# Patient Record
Sex: Female | Born: 1965 | Race: White | Hispanic: No | Marital: Married | Smoking: Never smoker
Health system: Southern US, Community
[De-identification: ages and names within clinical notes are randomized; demographics above are authoritative.]

## PROBLEM LIST (undated history)

## (undated) DIAGNOSIS — Q330 Congenital cystic lung: Secondary | ICD-10-CM

## (undated) DIAGNOSIS — J939 Pneumothorax, unspecified: Principal | ICD-10-CM

## (undated) DIAGNOSIS — M1990 Unspecified osteoarthritis, unspecified site: Secondary | ICD-10-CM

## (undated) DIAGNOSIS — Z460 Encounter for fitting and adjustment of spectacles and contact lenses: Secondary | ICD-10-CM

## (undated) DIAGNOSIS — M24022 Loose body in left elbow: Secondary | ICD-10-CM

## (undated) HISTORY — PX: CHEST TUBE INSERTION: SHX231

## (undated) HISTORY — DX: Pneumothorax, unspecified: J93.9

---

## 1988-01-14 HISTORY — PX: ORIF ELBOW FRACTURE: SUR928

## 1995-01-14 DIAGNOSIS — J939 Pneumothorax, unspecified: Secondary | ICD-10-CM

## 1995-01-14 HISTORY — DX: Pneumothorax, unspecified: J93.9

## 1997-01-13 HISTORY — PX: PLEURAL SCARIFICATION: SHX748

## 1999-01-14 HISTORY — PX: OVARY SURGERY: SHX727

## 2005-01-13 HISTORY — PX: LUMBAR LAMINECTOMY: SHX95

## 2006-12-23 ENCOUNTER — Emergency Department (HOSPITAL_COMMUNITY): Admission: EM | Admit: 2006-12-23 | Discharge: 2006-12-23 | Payer: Self-pay | Admitting: Family Medicine

## 2006-12-30 ENCOUNTER — Emergency Department (HOSPITAL_COMMUNITY): Admission: EM | Admit: 2006-12-30 | Discharge: 2006-12-30 | Payer: Self-pay | Admitting: Family Medicine

## 2007-01-21 ENCOUNTER — Emergency Department (HOSPITAL_COMMUNITY): Admission: EM | Admit: 2007-01-21 | Discharge: 2007-01-21 | Payer: Self-pay | Admitting: Emergency Medicine

## 2007-02-16 ENCOUNTER — Encounter: Admission: RE | Admit: 2007-02-16 | Discharge: 2007-02-16 | Payer: Self-pay | Admitting: Family Medicine

## 2007-07-06 ENCOUNTER — Other Ambulatory Visit: Admission: RE | Admit: 2007-07-06 | Discharge: 2007-07-06 | Payer: Self-pay | Admitting: Family Medicine

## 2007-09-09 ENCOUNTER — Encounter: Admission: RE | Admit: 2007-09-09 | Discharge: 2007-09-09 | Payer: Self-pay | Admitting: Family Medicine

## 2008-11-14 ENCOUNTER — Ambulatory Visit (HOSPITAL_COMMUNITY): Admission: RE | Admit: 2008-11-14 | Discharge: 2008-11-14 | Payer: Self-pay | Admitting: Family Medicine

## 2008-11-15 ENCOUNTER — Encounter: Admission: RE | Admit: 2008-11-15 | Discharge: 2008-11-15 | Payer: Self-pay | Admitting: Family Medicine

## 2008-12-22 ENCOUNTER — Other Ambulatory Visit: Admission: RE | Admit: 2008-12-22 | Discharge: 2008-12-22 | Payer: Self-pay | Admitting: Family Medicine

## 2009-11-16 ENCOUNTER — Ambulatory Visit (HOSPITAL_COMMUNITY): Admission: RE | Admit: 2009-11-16 | Discharge: 2009-11-16 | Payer: Self-pay | Admitting: Family Medicine

## 2009-12-24 ENCOUNTER — Other Ambulatory Visit
Admission: RE | Admit: 2009-12-24 | Discharge: 2009-12-24 | Payer: Self-pay | Source: Home / Self Care | Admitting: Family Medicine

## 2009-12-24 ENCOUNTER — Encounter
Admission: RE | Admit: 2009-12-24 | Discharge: 2009-12-24 | Payer: Self-pay | Source: Home / Self Care | Attending: Family Medicine | Admitting: Family Medicine

## 2010-10-18 LAB — CBC
HCT: 43.3
Hemoglobin: 14.8
MCV: 93
Platelets: 295
WBC: 8.6

## 2010-10-18 LAB — DIFFERENTIAL
Eosinophils Absolute: 0.2
Eosinophils Relative: 2
Lymphocytes Relative: 25
Lymphs Abs: 2.1
Monocytes Absolute: 0.9

## 2010-11-08 ENCOUNTER — Other Ambulatory Visit (HOSPITAL_COMMUNITY): Payer: Self-pay | Admitting: Family Medicine

## 2010-11-08 DIAGNOSIS — Z1231 Encounter for screening mammogram for malignant neoplasm of breast: Secondary | ICD-10-CM

## 2010-12-11 ENCOUNTER — Ambulatory Visit (HOSPITAL_COMMUNITY)
Admission: RE | Admit: 2010-12-11 | Discharge: 2010-12-11 | Disposition: A | Discharge status: Home / Self Care | Payer: BC Managed Care – PPO | Source: Ambulatory Visit | Attending: Family Medicine | Admitting: Family Medicine

## 2010-12-11 DIAGNOSIS — Z1231 Encounter for screening mammogram for malignant neoplasm of breast: Secondary | ICD-10-CM

## 2010-12-17 ENCOUNTER — Other Ambulatory Visit: Payer: Self-pay | Admitting: Family Medicine

## 2010-12-17 DIAGNOSIS — R928 Other abnormal and inconclusive findings on diagnostic imaging of breast: Secondary | ICD-10-CM

## 2010-12-24 ENCOUNTER — Ambulatory Visit
Admission: RE | Admit: 2010-12-24 | Discharge: 2010-12-24 | Disposition: A | Discharge status: Home / Self Care | Payer: BC Managed Care – PPO | Source: Ambulatory Visit | Attending: Family Medicine | Admitting: Family Medicine

## 2010-12-24 ENCOUNTER — Other Ambulatory Visit: Payer: BC Managed Care – PPO

## 2010-12-24 ENCOUNTER — Other Ambulatory Visit: Payer: Self-pay | Admitting: Family Medicine

## 2010-12-24 DIAGNOSIS — R928 Other abnormal and inconclusive findings on diagnostic imaging of breast: Secondary | ICD-10-CM

## 2010-12-24 HISTORY — PX: BREAST BIOPSY: SHX20

## 2011-06-20 ENCOUNTER — Telehealth: Payer: Self-pay | Admitting: Genetic Counselor

## 2011-06-20 NOTE — Telephone Encounter (Signed)
lmonvm for pt re calling me for appt w/Karen Lowell Guitar for genetics.

## 2011-06-23 ENCOUNTER — Telehealth: Payer: Self-pay | Admitting: Genetic Counselor

## 2011-06-23 NOTE — Telephone Encounter (Signed)
Pt returned call re genetics appt and was given appt for 6/13 @ 11 am.

## 2011-06-24 ENCOUNTER — Telehealth: Payer: Self-pay | Admitting: Genetic Counselor

## 2011-06-24 NOTE — Telephone Encounter (Signed)
No call was made.  Accidentally opened this.

## 2011-06-26 ENCOUNTER — Encounter: Payer: Self-pay | Admitting: Genetic Counselor

## 2011-06-26 ENCOUNTER — Other Ambulatory Visit: Payer: BC Managed Care – PPO | Admitting: Lab

## 2011-06-26 ENCOUNTER — Ambulatory Visit (HOSPITAL_BASED_OUTPATIENT_CLINIC_OR_DEPARTMENT_OTHER): Payer: BC Managed Care – PPO | Admitting: Genetic Counselor

## 2011-06-26 DIAGNOSIS — J9383 Other pneumothorax: Secondary | ICD-10-CM

## 2011-06-26 DIAGNOSIS — J939 Pneumothorax, unspecified: Secondary | ICD-10-CM

## 2011-06-26 DIAGNOSIS — IMO0002 Reserved for concepts with insufficient information to code with codable children: Secondary | ICD-10-CM

## 2011-06-26 NOTE — Progress Notes (Signed)
Dr. Valentina Lucks requested a consultation for genetic counseling and risk assessment for Kim Garner, a 46 y.o. female, for discussion of her personal history pneumothroax and family history of Birt-Hogg-Dube (BHD) syndrome. She presents to clinic today to discuss the possibility of a genetic predisposition to cancer, and to further clarify her risks, as well as her family members' risks for cancer.   HISTORY OF PRESENT ILLNESS: In 1997, at the age of 5, Kim Garner was diagnosed with a pneumothorax.  In 1999 at the age of 55, she was diagnosed with a second pnuemothorax.  At that time she had multiple CT scans of her chest/lungs which found lung cysts and belbs. This was treated with surgery, and reportedly performed so that scar tissue would help with reinforcing the lung and keeping it from recurring.    Past Medical History  Diagnosis Date  . Pneumothorax 1997    Pneumothorax in 1997 and 1999     REPRODUCTIVE HISTORY AND PERSONAL RISK ASSESSMENT FACTORS: Chest CT performed in 1997 and 1999 - bilateral blebs Never had a CT, u/s or MRI of the kidneys Is scheduled with a dermatologist in the next few weeks  FAMILY HISTORY:  We obtained a detailed, 4-generation family history.  Significant diagnoses are listed below: Family History  Problem Relation Age of Onset  . Lung disease Maternal Uncle     pneumothorax in 65s  . Lung disease Cousin 30    pneumothorax  . Lung disease Other     Birt Noemi Chapel syndrome  The patient was diagnosed with a pneumothorax at ages 39 and 27, which was treated with surgery.  She reports have several white bumps on her skin which have not caused her any problems.  She has a sister who has never had a pneumothorax and has not been worked up for BHD syndrome.  Her mother is healthy at age 67, and never had a pneumothorax or kidney disease.  The patient has a healthy aunt, and an uncle who had a pneumothroax in his 56s.  This uncle has three children, and  his daughter, who is 42, had a pneumothorax.  The patients' maternal grandmother, four of her brothers and at least one sister, and her maternal aunt had pneumothoraces.  The sister had a daughter who had renal cell carcinoma, and tested positive for BHD syndrome.  This individual has two daughters who have also tested positive for BHD syndrome.  The patient's father has an irregular heartbeat, at age 37.  He had a brother who had prostate cancer and died at 59 from a form of leukemia.  Her paternal grandmother had breast cancer and died in her 39s, and her grandfather died in his 45s from leukemia.  Patient's maternal ancestors are of [place] and [place] descent, and paternal ancestors are of [place] and [place] descent. There is no reported Ashkenazi Jewish ancestry. There is possible known consanguinity between her maternal grandparents.  GENETIC COUNSELING RISK ASSESSMENT, DISCUSSION, AND SUGGESTED FOLLOW UP: We reviewed the natural history and genetic etiology of sporadic, familial and hereditary cancer syndromes.  We discussed the signs and symptoms of BHD syndrome, and the dominant inheritance pattern.  Based on her current symptoms it is suggestive that she is affected, although genetic testing would help with diagnosis.  We reviewed genetic testing and that it would be best to have a copy of her family members test result.  This would allow for targeted testing which would reduce the cost of testing and  ensure that we are testing for the correct mutation.  She was provided information about the national support group, and we discussed the pros and cons of being seen in a center of excellence for BHD syndrome.  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 If a mutation indicating BHD syndrome is  detected in this case, the [place] BHD Consortium recommendations would include annual MRI scans starting at age 58. If a mutation is detected, the patient will be referred back to the referring provider and to any additional appropriate care providers to discuss the relevant options.   After considering the risks, benefits, and limitations, the patient provided agreed to try to get a copy of her cousins test so that we may know the exact family mutation and the laboratory in which the original test was performed.   Per the patient's request, she will contact me once she has the report and we can set up a blood draw at that time.  The patient was seen for a total of 45 minutes, greater than 50% of which was spent face-to-face counseling.  This plan is being carried out per Dr. Jone Baseman recommendations.  This note will also be sent to the referring provider via the electronic medical record. The patient will be supplied with a summary of this genetic counseling discussion as well as educational information on the discussed hereditary cancer syndromes following the conclusion of their visit.   Patient was discussed with Dr. Drue Second.  EDUCATIONAL INFORMATION SUPPLIED TO PATIENT AT ENCOUNTER:  Genetics Home Reference Print out on BHD syndrome Brochures from the BHD syndrome support group   _______________________________________________________________________ For Office Staff:  Number of people involved in session: 2 Was an Intern/ student involved with case: not applicable

## 2011-07-02 ENCOUNTER — Telehealth: Payer: Self-pay | Admitting: *Deleted

## 2011-07-02 NOTE — Telephone Encounter (Signed)
Left message for pt to return my call so I can schedule a genetic f/u appt.

## 2011-07-08 ENCOUNTER — Telehealth: Payer: Self-pay | Admitting: *Deleted

## 2011-07-08 NOTE — Telephone Encounter (Signed)
Patient returned my call and I confirmed 08/11/11 genetic appt w/ pt.

## 2011-07-24 ENCOUNTER — Other Ambulatory Visit: Payer: BC Managed Care – PPO | Admitting: Lab

## 2011-08-11 ENCOUNTER — Other Ambulatory Visit: Payer: BC Managed Care – PPO | Admitting: Lab

## 2011-08-11 ENCOUNTER — Encounter: Payer: BC Managed Care – PPO | Admitting: Genetic Counselor

## 2011-09-02 ENCOUNTER — Encounter: Payer: Self-pay | Admitting: Genetic Counselor

## 2011-09-11 ENCOUNTER — Other Ambulatory Visit: Payer: Self-pay | Admitting: Family Medicine

## 2011-09-11 DIAGNOSIS — Z148 Genetic carrier of other disease: Secondary | ICD-10-CM

## 2011-09-18 ENCOUNTER — Ambulatory Visit
Admission: RE | Admit: 2011-09-18 | Discharge: 2011-09-18 | Disposition: A | Discharge status: Home / Self Care | Payer: BC Managed Care – PPO | Source: Ambulatory Visit | Attending: Family Medicine | Admitting: Family Medicine

## 2011-09-18 DIAGNOSIS — Z148 Genetic carrier of other disease: Secondary | ICD-10-CM

## 2011-09-18 MED ORDER — GADOBENATE DIMEGLUMINE 529 MG/ML IV SOLN
15.0000 mL | Freq: Once | INTRAVENOUS | Status: AC | PRN
  Administered 2011-09-18: 15 mL via INTRAVENOUS

## 2011-12-18 ENCOUNTER — Other Ambulatory Visit: Payer: Self-pay | Admitting: Family Medicine

## 2011-12-18 DIAGNOSIS — Z1231 Encounter for screening mammogram for malignant neoplasm of breast: Secondary | ICD-10-CM

## 2012-01-13 ENCOUNTER — Ambulatory Visit
Admission: RE | Admit: 2012-01-13 | Discharge: 2012-01-13 | Disposition: A | Discharge status: Home / Self Care | Payer: BC Managed Care – PPO | Source: Ambulatory Visit | Attending: Family Medicine | Admitting: Family Medicine

## 2012-01-13 DIAGNOSIS — Z1231 Encounter for screening mammogram for malignant neoplasm of breast: Secondary | ICD-10-CM

## 2012-12-20 ENCOUNTER — Other Ambulatory Visit: Payer: Self-pay | Admitting: Orthopedic Surgery

## 2012-12-20 ENCOUNTER — Encounter (HOSPITAL_BASED_OUTPATIENT_CLINIC_OR_DEPARTMENT_OTHER): Payer: Self-pay | Admitting: *Deleted

## 2012-12-20 NOTE — Progress Notes (Signed)
No labs needed

## 2012-12-21 ENCOUNTER — Ambulatory Visit (HOSPITAL_BASED_OUTPATIENT_CLINIC_OR_DEPARTMENT_OTHER): Payer: BC Managed Care – PPO | Admitting: *Deleted

## 2012-12-21 ENCOUNTER — Encounter (HOSPITAL_BASED_OUTPATIENT_CLINIC_OR_DEPARTMENT_OTHER)
Admission: RE | Disposition: A | Discharge status: Home / Self Care | Payer: Self-pay | Source: Ambulatory Visit | Attending: Orthopedic Surgery

## 2012-12-21 ENCOUNTER — Ambulatory Visit (HOSPITAL_BASED_OUTPATIENT_CLINIC_OR_DEPARTMENT_OTHER)
Admission: RE | Admit: 2012-12-21 | Discharge: 2012-12-21 | Disposition: A | Discharge status: Home / Self Care | Payer: BC Managed Care – PPO | Source: Ambulatory Visit | Attending: Orthopedic Surgery | Admitting: Orthopedic Surgery

## 2012-12-21 ENCOUNTER — Encounter (HOSPITAL_BASED_OUTPATIENT_CLINIC_OR_DEPARTMENT_OTHER): Payer: BC Managed Care – PPO | Admitting: *Deleted

## 2012-12-21 ENCOUNTER — Encounter (HOSPITAL_BASED_OUTPATIENT_CLINIC_OR_DEPARTMENT_OTHER): Payer: Self-pay

## 2012-12-21 DIAGNOSIS — M24029 Loose body in unspecified elbow: Secondary | ICD-10-CM | POA: Insufficient documentation

## 2012-12-21 DIAGNOSIS — M24022 Loose body in left elbow: Secondary | ICD-10-CM | POA: Diagnosis present

## 2012-12-21 DIAGNOSIS — M129 Arthropathy, unspecified: Secondary | ICD-10-CM | POA: Insufficient documentation

## 2012-12-21 HISTORY — PX: ELBOW ARTHROSCOPY: SHX614

## 2012-12-21 HISTORY — DX: Loose body in left elbow: M24.022

## 2012-12-21 HISTORY — DX: Unspecified osteoarthritis, unspecified site: M19.90

## 2012-12-21 HISTORY — DX: Encounter for fitting and adjustment of spectacles and contact lenses: Z46.0

## 2012-12-21 HISTORY — DX: Congenital cystic lung: Q33.0

## 2012-12-21 HISTORY — PX: FOREIGN BODY REMOVAL: SHX962

## 2012-12-21 LAB — POCT HEMOGLOBIN-HEMACUE: Hemoglobin: 14.7 g/dL (ref 12.0–15.0)

## 2012-12-21 SURGERY — ARTHROSCOPY, ELBOW, WITH OPEN SURGERY IF INDICATED
Anesthesia: General | Site: Elbow | Laterality: Left

## 2012-12-21 MED ORDER — PROPOFOL 10 MG/ML IV BOLUS
INTRAVENOUS | Status: DC | PRN
  Administered 2012-12-21: 170 mg via INTRAVENOUS

## 2012-12-21 MED ORDER — SUCCINYLCHOLINE CHLORIDE 20 MG/ML IJ SOLN
INTRAMUSCULAR | Status: AC
  Filled 2012-12-21: qty 2

## 2012-12-21 MED ORDER — HYDROMORPHONE HCL PF 1 MG/ML IJ SOLN
INTRAMUSCULAR | Status: AC
  Filled 2012-12-21: qty 1

## 2012-12-21 MED ORDER — ONDANSETRON HCL 4 MG/2ML IJ SOLN
INTRAMUSCULAR | Status: DC | PRN
  Administered 2012-12-21: 4 mg via INTRAVENOUS

## 2012-12-21 MED ORDER — OXYCODONE HCL 5 MG PO TABS
ORAL_TABLET | ORAL | Status: AC
  Filled 2012-12-21: qty 1

## 2012-12-21 MED ORDER — KETOROLAC TROMETHAMINE 10 MG PO TABS: 10.0000 mg | ORAL_TABLET | Freq: Four times a day (QID) | ORAL | Status: DC | PRN

## 2012-12-21 MED ORDER — BUPIVACAINE HCL (PF) 0.25 % IJ SOLN
INTRAMUSCULAR | Status: DC | PRN
  Administered 2012-12-21: 10 mL

## 2012-12-21 MED ORDER — FENTANYL CITRATE 0.05 MG/ML IJ SOLN
INTRAMUSCULAR | Status: AC
  Filled 2012-12-21: qty 4

## 2012-12-21 MED ORDER — HYDROMORPHONE HCL PF 1 MG/ML IJ SOLN
0.2500 mg | INTRAMUSCULAR | Status: DC | PRN
  Administered 2012-12-21 (×4): 0.5 mg via INTRAVENOUS

## 2012-12-21 MED ORDER — BUPIVACAINE HCL (PF) 0.25 % IJ SOLN
INTRAMUSCULAR | Status: AC
  Filled 2012-12-21: qty 30

## 2012-12-21 MED ORDER — MIDAZOLAM HCL 5 MG/5ML IJ SOLN
INTRAMUSCULAR | Status: DC | PRN
  Administered 2012-12-21: 2 mg via INTRAVENOUS

## 2012-12-21 MED ORDER — LACTATED RINGERS IV SOLN
INTRAVENOUS | Status: DC
  Administered 2012-12-21 (×2): via INTRAVENOUS

## 2012-12-21 MED ORDER — OXYCODONE-ACETAMINOPHEN 10-325 MG PO TABS: 1.0000 | ORAL_TABLET | Freq: Four times a day (QID) | ORAL | Status: DC | PRN

## 2012-12-21 MED ORDER — PROMETHAZINE HCL 25 MG PO TABS: 25.0000 mg | ORAL_TABLET | Freq: Four times a day (QID) | ORAL | Status: DC | PRN

## 2012-12-21 MED ORDER — FENTANYL CITRATE 0.05 MG/ML IJ SOLN: 50.0000 ug | INTRAMUSCULAR | Status: DC | PRN

## 2012-12-21 MED ORDER — ONDANSETRON HCL 4 MG/2ML IJ SOLN: 4.0000 mg | Freq: Four times a day (QID) | INTRAMUSCULAR | Status: DC | PRN

## 2012-12-21 MED ORDER — MIDAZOLAM HCL 2 MG/2ML IJ SOLN
INTRAMUSCULAR | Status: AC
  Filled 2012-12-21: qty 2

## 2012-12-21 MED ORDER — PROPOFOL 10 MG/ML IV EMUL
INTRAVENOUS | Status: AC
  Filled 2012-12-21: qty 100

## 2012-12-21 MED ORDER — SENNA-DOCUSATE SODIUM 8.6-50 MG PO TABS: 2.0000 | ORAL_TABLET | Freq: Every day | ORAL | Status: DC

## 2012-12-21 MED ORDER — SODIUM CHLORIDE 0.9 % IR SOLN
Status: DC | PRN
  Administered 2012-12-21: 3000 mL

## 2012-12-21 MED ORDER — DEXAMETHASONE SODIUM PHOSPHATE 10 MG/ML IJ SOLN
INTRAMUSCULAR | Status: DC | PRN
  Administered 2012-12-21: 10 mg via INTRAVENOUS

## 2012-12-21 MED ORDER — MIDAZOLAM HCL 2 MG/2ML IJ SOLN: 1.0000 mg | INTRAMUSCULAR | Status: DC | PRN

## 2012-12-21 MED ORDER — CEFAZOLIN SODIUM-DEXTROSE 2-3 GM-% IV SOLR
INTRAVENOUS | Status: AC
  Filled 2012-12-21: qty 50

## 2012-12-21 MED ORDER — CEFAZOLIN SODIUM-DEXTROSE 2-3 GM-% IV SOLR
2.0000 g | INTRAVENOUS | Status: AC
  Administered 2012-12-21: 2 g via INTRAVENOUS

## 2012-12-21 MED ORDER — OXYCODONE HCL 5 MG/5ML PO SOLN: 5.0000 mg | Freq: Once | ORAL | Status: AC | PRN

## 2012-12-21 MED ORDER — OXYCODONE HCL 5 MG PO TABS
5.0000 mg | ORAL_TABLET | Freq: Once | ORAL | Status: AC | PRN
  Administered 2012-12-21: 5 mg via ORAL

## 2012-12-21 MED ORDER — LIDOCAINE HCL (CARDIAC) 20 MG/ML IV SOLN
INTRAVENOUS | Status: DC | PRN
  Administered 2012-12-21: 60 mg via INTRAVENOUS

## 2012-12-21 MED ORDER — FENTANYL CITRATE 0.05 MG/ML IJ SOLN
INTRAMUSCULAR | Status: DC | PRN
  Administered 2012-12-21: 100 ug via INTRAVENOUS
  Administered 2012-12-21: 25 ug via INTRAVENOUS
  Administered 2012-12-21: 50 ug via INTRAVENOUS
  Administered 2012-12-21: 25 ug via INTRAVENOUS

## 2012-12-21 SURGICAL SUPPLY — 95 items
BANDAGE ELASTIC 4 VELCRO ST LF (GAUZE/BANDAGES/DRESSINGS) ×4 IMPLANT
BANDAGE ELASTIC 6 VELCRO ST LF (GAUZE/BANDAGES/DRESSINGS) IMPLANT
BANDAGE ESMARK 6X9 LF (GAUZE/BANDAGES/DRESSINGS) ×1 IMPLANT
BENZOIN TINCTURE PRP APPL 2/3 (GAUZE/BANDAGES/DRESSINGS) IMPLANT
BLADE CUDA GRT WHITE 3.5 (BLADE) IMPLANT
BLADE CUDA SHAVER 3.5 (BLADE) IMPLANT
BLADE CUTTER GATOR 3.5 (BLADE) ×2 IMPLANT
BLADE GREAT WHITE 4.2 (BLADE) IMPLANT
BLADE SURG 15 STRL LF DISP TIS (BLADE) IMPLANT
BLADE SURG 15 STRL SS (BLADE)
BNDG COHESIVE 4X5 TAN STRL (GAUZE/BANDAGES/DRESSINGS) ×2 IMPLANT
BNDG CONFORM 3 STRL LF (GAUZE/BANDAGES/DRESSINGS) IMPLANT
BNDG ESMARK 4X9 LF (GAUZE/BANDAGES/DRESSINGS) ×2 IMPLANT
BNDG ESMARK 6X9 LF (GAUZE/BANDAGES/DRESSINGS) ×2
BRUSH SCRUB EZ PLAIN DRY (MISCELLANEOUS) IMPLANT
BUR FULL RADIUS 2.9 (BURR) IMPLANT
BUR OVAL 4.0 (BURR) IMPLANT
CANISTER SUCT 1200ML W/VALVE (MISCELLANEOUS) ×2 IMPLANT
CANNULA ACUFLEX KIT 5X76 (CANNULA) IMPLANT
CANNULA SHOULDER 7CM (CANNULA) ×2 IMPLANT
COVER TABLE BACK 60X90 (DRAPES) IMPLANT
CUFF TOURNIQUET SINGLE 18IN (TOURNIQUET CUFF) ×2 IMPLANT
CUFF TOURNIQUET SINGLE 34IN LL (TOURNIQUET CUFF) IMPLANT
CUTTER MENISCUS  4.2MM (BLADE)
CUTTER MENISCUS 4.2MM (BLADE) IMPLANT
DECANTER SPIKE VIAL GLASS SM (MISCELLANEOUS) IMPLANT
DRAPE ARTHROSCOPY W/POUCH 90 (DRAPES) IMPLANT
DRAPE EXTREMITY T 121X128X90 (DRAPE) ×2 IMPLANT
DRAPE INCISE IOBAN 66X45 STRL (DRAPES) IMPLANT
DRAPE OEC MINIVIEW 54X84 (DRAPES) IMPLANT
DRAPE STERI 35X30 U-POUCH (DRAPES) ×2 IMPLANT
DRAPE U 20/CS (DRAPES) IMPLANT
DRAPE U-SHAPE 47X51 STRL (DRAPES) ×2 IMPLANT
DURAPREP 26ML APPLICATOR (WOUND CARE) ×2 IMPLANT
ELECT REM PT RETURN 9FT ADLT (ELECTROSURGICAL) ×2
ELECTRODE REM PT RTRN 9FT ADLT (ELECTROSURGICAL) ×1 IMPLANT
GAUZE XEROFORM 1X8 LF (GAUZE/BANDAGES/DRESSINGS) IMPLANT
GLOVE BIO SURGEON STRL SZ 6.5 (GLOVE) ×2 IMPLANT
GLOVE BIO SURGEON STRL SZ8 (GLOVE) ×2 IMPLANT
GLOVE BIO SURGEON STRL SZ8.5 (GLOVE) ×2 IMPLANT
GLOVE BIOGEL PI IND STRL 7.0 (GLOVE) ×1 IMPLANT
GLOVE BIOGEL PI IND STRL 8 (GLOVE) ×1 IMPLANT
GLOVE BIOGEL PI INDICATOR 7.0 (GLOVE) ×1
GLOVE BIOGEL PI INDICATOR 8 (GLOVE) ×1
GLOVE ORTHO TXT STRL SZ7.5 (GLOVE) ×2 IMPLANT
GLOVE SURG ORTHO 8.0 STRL STRW (GLOVE) ×2 IMPLANT
GOWN BRE IMP PREV XXLGXLNG (GOWN DISPOSABLE) ×4 IMPLANT
HANDPIECE INTERPULSE COAX TIP (DISPOSABLE)
NDL SAFETY ECLIPSE 18X1.5 (NEEDLE) IMPLANT
NEEDLE HYPO 18GX1.5 SHARP (NEEDLE)
NEEDLE HYPO 25X1 1.5 SAFETY (NEEDLE) IMPLANT
NEEDLE SPNL 18GX3.5 QUINCKE PK (NEEDLE) ×2 IMPLANT
NS IRRIG 1000ML POUR BTL (IV SOLUTION) IMPLANT
PACK ARTHROSCOPY DSU (CUSTOM PROCEDURE TRAY) ×2 IMPLANT
PACK BASIN DAY SURGERY FS (CUSTOM PROCEDURE TRAY) ×2 IMPLANT
PAD ABD 8X10 STRL (GAUZE/BANDAGES/DRESSINGS) IMPLANT
PAD CAST 4YDX4 CTTN HI CHSV (CAST SUPPLIES) ×1 IMPLANT
PADDING CAST ABS 4INX4YD NS (CAST SUPPLIES)
PADDING CAST ABS COTTON 4X4 ST (CAST SUPPLIES) IMPLANT
PADDING CAST COTTON 4X4 STRL (CAST SUPPLIES) ×1
PENCIL BUTTON HOLSTER BLD 10FT (ELECTRODE) IMPLANT
SET ARTHROSCOPY TUBING (MISCELLANEOUS) ×1
SET ARTHROSCOPY TUBING LN (MISCELLANEOUS) ×1 IMPLANT
SET HNDPC FAN SPRY TIP SCT (DISPOSABLE) IMPLANT
SHEET MEDIUM DRAPE 40X70 STRL (DRAPES) IMPLANT
SLEEVE SCD COMPRESS KNEE MED (MISCELLANEOUS) ×2 IMPLANT
SLING ARM FOAM STRAP LRG (SOFTGOODS) ×2 IMPLANT
SLING ARM FOAM STRAP MED (SOFTGOODS) IMPLANT
SPLINT FAST PLASTER 5X30 (CAST SUPPLIES) ×10
SPLINT PLASTER CAST FAST 5X30 (CAST SUPPLIES) ×10 IMPLANT
SPONGE GAUZE 4X4 12PLY (GAUZE/BANDAGES/DRESSINGS) ×2 IMPLANT
SPONGE LAP 18X18 X RAY DECT (DISPOSABLE) IMPLANT
SPONGE LAP 4X18 X RAY DECT (DISPOSABLE) ×2 IMPLANT
STAPLER VISISTAT 35W (STAPLE) IMPLANT
STOCKINETTE SYNTHETIC 6 UNSTER (CAST SUPPLIES) IMPLANT
STRIP CLOSURE SKIN 1/2X4 (GAUZE/BANDAGES/DRESSINGS) IMPLANT
SUT ETHILON 3 0 PS 1 (SUTURE) IMPLANT
SUT ETHILON 4 0 PS 2 18 (SUTURE) IMPLANT
SUT MNCRL AB 4-0 PS2 18 (SUTURE) ×2 IMPLANT
SUT VIC AB 0 CT1 27 (SUTURE)
SUT VIC AB 0 CT1 27XBRD ANBCTR (SUTURE) IMPLANT
SUT VIC AB 2-0 PS2 27 (SUTURE) IMPLANT
SUT VIC AB 2-0 SH 18 (SUTURE) IMPLANT
SUT VIC AB 2-0 SH 27 (SUTURE) ×1
SUT VIC AB 2-0 SH 27XBRD (SUTURE) ×1 IMPLANT
SUT VIC AB 3-0 SH 27 (SUTURE)
SUT VIC AB 3-0 SH 27X BRD (SUTURE) IMPLANT
SUT VICRYL 3-0 CR8 SH (SUTURE) IMPLANT
SUT VICRYL 4-0 PS2 18IN ABS (SUTURE) IMPLANT
SWAB COLLECTION DEVICE MRSA (MISCELLANEOUS) IMPLANT
SYR BULB 3OZ (MISCELLANEOUS) IMPLANT
TOWEL OR 17X24 6PK STRL BLUE (TOWEL DISPOSABLE) ×2 IMPLANT
TUBE ANAEROBIC SPECIMEN COL (MISCELLANEOUS) IMPLANT
UNDERPAD 30X30 INCONTINENT (UNDERPADS AND DIAPERS) ×2 IMPLANT
WATER STERILE IRR 1000ML POUR (IV SOLUTION) ×2 IMPLANT

## 2012-12-21 NOTE — Anesthesia Postprocedure Evaluation (Signed)
Anesthesia Post Note  Patient: Kim Garner  Procedure(s) Performed: Procedure(s) (LRB): LEFT ARTHROSCOPY ELBOW (Left) LEFT ELBOW REMOVAL LOSE BODY  (Left)  Anesthesia type: General  Patient location: PACU  Post pain: Pain level controlled and Adequate analgesia  Post assessment: Post-op Vital signs reviewed, Patient's Cardiovascular Status Stable, Respiratory Function Stable, Patent Airway and Pain level controlled  Last Vitals:  Filed Vitals:   12/21/12 1000  BP: 124/76  Pulse: 84  Temp:   Resp: 11    Post vital signs: Reviewed and stable  Level of consciousness: awake, alert  and oriented  Complications: No apparent anesthesia complications

## 2012-12-21 NOTE — H&P (Signed)
PREOPERATIVE H&P  Chief Complaint: LEFT ELBOW LOSE BODY  HPI: Kim Garner is a 47 y.o. female who presents for preoperative history and physical with a diagnosis of LEFT ELBOW LOSE BODY. Symptoms are rated as moderate to severe, and have been worsening.  This is significantly impairing activities of daily living.  She has elected for surgical management. She had a previous elbow fracture that was about 20 years ago, and then had a previous elbow arthroscopy with loose body removal done about 10 years ago. She developed acute onset pain, with acute loss of motion and a locked elbow about a week ago, presented to urgent care, was seen by myself yesterday, and elected for surgical management.  Past Medical History  Diagnosis Date  . Pneumothorax 1997    Pneumothorax in 1997 and 1999  . Arthritis   . Lung, cysts, congenital   . Contact lens/glasses fitting     wears contacts or glasses   Past Surgical History  Procedure Laterality Date  . 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 Chest tube insertion  9604,5409    lt spont pneumo  . Orif elbow fracture  1990    left  . Ovary surgery  2001    ovarian cyst  . Lumbar laminectomy  2007   History   Social History  . Marital Status: Married    Spouse Name: N/A    Number of Children: N/A  . Years of Education: N/A   Social History Main Topics  . Smoking status: Never Smoker   . Smokeless tobacco: Never Used  . Alcohol Use: Yes  . Drug Use: No  . Sexual Activity: None   Other Topics Concern  . None   Social History Narrative  . None   Family History  Problem Relation Age of Onset  . Lung disease Maternal Uncle     pneumothorax in 40s  . Lung disease Cousin 30    pneumothorax  . Lung disease Other     Birt Hogg Dube syndrome   Allergies  Allergen Reactions  . Pine Itching    PINE NUTS   Prior to Admission medications   Medication Sig Start Date End Date Taking? Authorizing Provider   diphenhydrAMINE (SOMINEX) 25 MG tablet Take 25 mg by mouth at bedtime as needed for sleep.   Yes Historical Provider, MD  fluticasone (FLONASE) 50 MCG/ACT nasal spray Place into both nostrils daily.   Yes Historical Provider, MD  norgestrel-ethinyl estradiol (LO/OVRAL,CRYSELLE) 0.3-30 MG-MCG tablet Take 1 tablet by mouth daily.   Yes Historical Provider, MD     Positive ROS: All other systems have been reviewed and were otherwise negative with the exception of those mentioned in the HPI and as above.  Physical Exam: General: Alert, no acute distress Cardiovascular: No pedal edema Respiratory: No cyanosis, no use of accessory musculature GI: No organomegaly, abdomen is soft and non-tender Skin: No lesions in the area of chief complaint Neurologic: Sensation intact distally Psychiatric: Patient is competent for consent with normal mood and affect Lymphatic: No axillary or cervical lymphadenopathy  MUSCULOSKELETAL: Left elbow has sensation intact throughout. Well-healed surgical wounds. Range of motion from 35 - 125.  MRI demonstrates large loose body, around the radiocapitellar joint  Assessment: LEFT ELBOW LOSE BODY, posttraumatic arthritis  Plan: Plan for Procedure(s): LEFT ARTHROSCOPY ELBOW LEFT ELBOW REMOVAL LOSE BODY   The risks benefits and alternatives were discussed with the patient including but not limited to the  risks of nonoperative treatment, versus surgical intervention including infection, bleeding, nerve injury,  blood clots, cardiopulmonary complications, morbidity, mortality, among others, and they were willing to proceed. We've also discussed the risks for recurrent loose by, stiffness, inability to regain motion, sensory and motor nerve injury, among others and she is going to proceed.  Eulas Post, MD Cell [phone]   12/21/2012 7:19 AM

## 2012-12-21 NOTE — Op Note (Signed)
12/21/2012  8:53 AM  PATIENT:  Kim Garner    PRE-OPERATIVE DIAGNOSIS:  LEFT ELBOW LOSE BODY x2  POST-OPERATIVE DIAGNOSIS:  Same  PROCEDURE:  LEFT ARTHROSCOPY ELBOW, LEFT ELBOW REMOVAL LOSE BODY   SURGEON:  Eulas Post, MD  PHYSICIAN ASSISTANT: Janace Litten, OPA-C, present and scrubbed throughout the case, critical for completion in a timely fashion, and for retraction, instrumentation, and closure.  ANESTHESIA:   General  PREOPERATIVE INDICATIONS:  Kim Garner is a  47 y.o. female with a diagnosis of LEFT ELBOW LOSE BODY who failed conservative measures and elected for surgical management.  She had an elbow that lacked about 30 of extension, that was an acute locked episode.  The risks benefits and alternatives were discussed with the patient preoperatively including but not limited to the risks of infection, bleeding, nerve injury, cardiopulmonary complications, the need for revision surgery, among others, and the patient was willing to proceed.  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 There were 2 large loose bodies, one approximately 1 x 1 cm, the other was 2 x 1 cm.  OPERATIVE PROCEDURE: The patient is brought to the operating room and placed in the supine position. General anesthesia was administered. IV antibiotics were given. She was in the supine position, and the arm holder was utilized. The left upper extremity was prepped and draped in usual sterile fashion. Time out was performed. The arm was elevated and exsanguinated and the tourniquet was inflated.  I infiltrated the joint with 20 cc of normal saline, and then made a superolateral ulnar incision through her previous portal site, and introduced the camera in that location. I then placed a superolateral portal, and did utilize cannula, although the cannula was too restrictive, and I ended up not utilizing the cannula, and a  diagnostic elbow arthroscopy was performed. There was extensive grade 4 loss on the radiocapitellar joint. The trochlea had some degenerative changes as well, but not as bad as the radiocapitellar joint. There were 2 very large loose bodies found in the lateral aspect of the radiocapitellar joint, and these were actually not accessible from the superior portal, but rather more accessible from a direct radiocapitellar portal. I used a spinal needle to localize this, and a small incision, and then dissected down bluntly to the capsule. The shaver was introduced, and I used the shaver to debulk some of the loose bodies, and ultimately mobilize them adequately to remove them. Some of the loose bodies came out piecemeal, using the pituitary rongeur. I did get both of the loose bodies out as visualized by the MRI, completed the diagnostic arthroscopy, and I did not feel that I needed to go posteriorly given the fact that I had our the achieved removal of both of the loose bodies. The "posterior loose body" was accessible from behind the capitellum from the radiocapitellar portal.  The instruments were removed, the portals closed with Monocryl followed by Steri-Strips and sterile gauze, she is placed into a posterior splint. She was awakened and returned to the PACU in stable and satisfactory condition. There were no complications.

## 2012-12-21 NOTE — Anesthesia Preprocedure Evaluation (Signed)
Anesthesia Evaluation  Patient identified by MRN, date of birth, ID band Patient awake    Reviewed: Allergy & Precautions, H&P , NPO status , Patient's Chart, lab work & pertinent test results  Airway Mallampati: II  Neck ROM: full    Dental   Pulmonary  H/o PTX (1997, 1999). H/o congenital lung cysts.         Cardiovascular negative cardio ROS      Neuro/Psych    GI/Hepatic   Endo/Other    Renal/GU      Musculoskeletal  (+) Arthritis -,   Abdominal   Peds  Hematology   Anesthesia Other Findings   Reproductive/Obstetrics                           Anesthesia Physical Anesthesia Plan  ASA: II  Anesthesia Plan: General   Post-op Pain Management:    Induction: Intravenous  Airway Management Planned: LMA  Additional Equipment:   Intra-op Plan:   Post-operative Plan:   Informed Consent: I have reviewed the patients History and Physical, chart, labs and discussed the procedure including the risks, benefits and alternatives for the proposed anesthesia with the patient or authorized representative who has indicated his/her understanding and acceptance.     Plan Discussed with: CRNA, Anesthesiologist and Surgeon  Anesthesia Plan Comments:         Anesthesia Quick Evaluation

## 2012-12-21 NOTE — Transfer of Care (Signed)
Immediate Anesthesia Transfer of Care Note  Patient: Kim Garner  Procedure(s) Performed: Procedure(s): LEFT ARTHROSCOPY ELBOW (Left) LEFT ELBOW REMOVAL LOSE BODY  (Left)  Patient Location: PACU  Anesthesia Type:General  Level of Consciousness: awake, alert  and patient cooperative  Airway & Oxygen Therapy: Patient Spontanous Breathing and Patient connected to face mask oxygen  Post-op Assessment: Report given to PACU RN, Post -op Vital signs reviewed and stable and Patient moving all extremities  Post vital signs: Reviewed and stable  Complications: No apparent anesthesia complications

## 2012-12-21 NOTE — Anesthesia Procedure Notes (Signed)
Procedure Name: LMA Insertion Date/Time: 12/21/2012 7:43 AM Performed by: Teryl Lucy P Pre-anesthesia Checklist: Patient identified, Emergency Drugs available, Suction available and Patient being monitored Patient Re-evaluated:Patient Re-evaluated prior to inductionOxygen Delivery Method: Circle System Utilized Preoxygenation: Pre-oxygenation with 100% oxygen Intubation Type: IV induction Ventilation: Mask ventilation without difficulty LMA: LMA inserted LMA Size: 4.0 Number of attempts: 1 Airway Equipment and Method: Bite block Placement Confirmation: positive ETCO2 and breath sounds checked- equal and bilateral Tube secured with: Tape Dental Injury: Teeth and Oropharynx as per pre-operative assessment

## 2012-12-22 ENCOUNTER — Encounter (HOSPITAL_BASED_OUTPATIENT_CLINIC_OR_DEPARTMENT_OTHER): Payer: Self-pay | Admitting: Orthopedic Surgery

## 2013-02-02 ENCOUNTER — Other Ambulatory Visit: Payer: Self-pay

## 2013-02-02 DIAGNOSIS — Z1231 Encounter for screening mammogram for malignant neoplasm of breast: Secondary | ICD-10-CM

## 2013-02-17 ENCOUNTER — Ambulatory Visit
Admission: RE | Admit: 2013-02-17 | Discharge: 2013-02-17 | Disposition: A | Discharge status: Home / Self Care | Payer: BC Managed Care – PPO | Source: Ambulatory Visit

## 2013-02-17 DIAGNOSIS — Z1231 Encounter for screening mammogram for malignant neoplasm of breast: Secondary | ICD-10-CM

## 2013-04-05 ENCOUNTER — Other Ambulatory Visit: Payer: Self-pay | Admitting: Family Medicine

## 2013-04-05 ENCOUNTER — Other Ambulatory Visit (HOSPITAL_COMMUNITY)
Admission: RE | Admit: 2013-04-05 | Discharge: 2013-04-05 | Disposition: A | Discharge status: Home / Self Care | Payer: BC Managed Care – PPO | Source: Ambulatory Visit | Attending: Family Medicine | Admitting: Family Medicine

## 2013-04-05 DIAGNOSIS — Z124 Encounter for screening for malignant neoplasm of cervix: Secondary | ICD-10-CM | POA: Insufficient documentation

## 2013-04-13 ENCOUNTER — Other Ambulatory Visit: Payer: Self-pay | Admitting: Family Medicine

## 2013-04-13 DIAGNOSIS — J9383 Other pneumothorax: Secondary | ICD-10-CM

## 2013-04-13 DIAGNOSIS — Z1509 Genetic susceptibility to other malignant neoplasm: Secondary | ICD-10-CM

## 2013-04-19 ENCOUNTER — Other Ambulatory Visit: Payer: BC Managed Care – PPO

## 2013-04-25 ENCOUNTER — Ambulatory Visit
Admission: RE | Admit: 2013-04-25 | Discharge: 2013-04-25 | Disposition: A | Discharge status: Home / Self Care | Payer: BC Managed Care – PPO | Source: Ambulatory Visit | Attending: Family Medicine | Admitting: Family Medicine

## 2013-04-25 DIAGNOSIS — J9383 Other pneumothorax: Secondary | ICD-10-CM

## 2013-04-25 DIAGNOSIS — Z1509 Genetic susceptibility to other malignant neoplasm: Secondary | ICD-10-CM

## 2013-04-25 MED ORDER — GADOBENATE DIMEGLUMINE 529 MG/ML IV SOLN
15.0000 mL | Freq: Once | INTRAVENOUS | Status: AC | PRN
  Administered 2013-04-25: 15 mL via INTRAVENOUS

## 2014-03-08 ENCOUNTER — Other Ambulatory Visit: Payer: Self-pay

## 2014-03-08 DIAGNOSIS — Z1231 Encounter for screening mammogram for malignant neoplasm of breast: Secondary | ICD-10-CM

## 2014-03-15 ENCOUNTER — Ambulatory Visit
Admission: RE | Admit: 2014-03-15 | Discharge: 2014-03-15 | Disposition: A | Discharge status: Home / Self Care | Payer: BLUE CROSS/BLUE SHIELD | Source: Ambulatory Visit

## 2014-03-15 DIAGNOSIS — Z1231 Encounter for screening mammogram for malignant neoplasm of breast: Secondary | ICD-10-CM

## 2015-03-19 ENCOUNTER — Other Ambulatory Visit: Payer: Self-pay

## 2015-03-19 ENCOUNTER — Other Ambulatory Visit: Payer: Self-pay | Admitting: Family Medicine

## 2015-03-19 DIAGNOSIS — C649 Malignant neoplasm of unspecified kidney, except renal pelvis: Secondary | ICD-10-CM

## 2015-03-19 DIAGNOSIS — Z1231 Encounter for screening mammogram for malignant neoplasm of breast: Secondary | ICD-10-CM

## 2015-03-26 ENCOUNTER — Ambulatory Visit
Admission: RE | Admit: 2015-03-26 | Discharge: 2015-03-26 | Disposition: A | Discharge status: Home / Self Care | Payer: BLUE CROSS/BLUE SHIELD | Source: Ambulatory Visit | Attending: Family Medicine | Admitting: Family Medicine

## 2015-03-26 DIAGNOSIS — C649 Malignant neoplasm of unspecified kidney, except renal pelvis: Secondary | ICD-10-CM

## 2015-03-26 MED ORDER — GADOBENATE DIMEGLUMINE 529 MG/ML IV SOLN
17.0000 mL | Freq: Once | INTRAVENOUS | Status: AC | PRN
  Administered 2015-03-26: 17 mL via INTRAVENOUS

## 2015-04-10 ENCOUNTER — Ambulatory Visit
Admission: RE | Admit: 2015-04-10 | Discharge: 2015-04-10 | Disposition: A | Discharge status: Home / Self Care | Payer: BLUE CROSS/BLUE SHIELD | Source: Ambulatory Visit

## 2015-04-10 DIAGNOSIS — Z1231 Encounter for screening mammogram for malignant neoplasm of breast: Secondary | ICD-10-CM

## 2015-10-12 DIAGNOSIS — M5431 Sciatica, right side: Secondary | ICD-10-CM | POA: Diagnosis not present

## 2015-10-12 DIAGNOSIS — M9903 Segmental and somatic dysfunction of lumbar region: Secondary | ICD-10-CM | POA: Diagnosis not present

## 2015-10-12 DIAGNOSIS — M9902 Segmental and somatic dysfunction of thoracic region: Secondary | ICD-10-CM | POA: Diagnosis not present

## 2015-10-12 DIAGNOSIS — M9905 Segmental and somatic dysfunction of pelvic region: Secondary | ICD-10-CM | POA: Diagnosis not present

## 2015-10-13 DIAGNOSIS — M9903 Segmental and somatic dysfunction of lumbar region: Secondary | ICD-10-CM | POA: Diagnosis not present

## 2015-10-13 DIAGNOSIS — M9902 Segmental and somatic dysfunction of thoracic region: Secondary | ICD-10-CM | POA: Diagnosis not present

## 2015-10-13 DIAGNOSIS — M9905 Segmental and somatic dysfunction of pelvic region: Secondary | ICD-10-CM | POA: Diagnosis not present

## 2015-10-16 DIAGNOSIS — M9905 Segmental and somatic dysfunction of pelvic region: Secondary | ICD-10-CM | POA: Diagnosis not present

## 2015-10-16 DIAGNOSIS — M9902 Segmental and somatic dysfunction of thoracic region: Secondary | ICD-10-CM | POA: Diagnosis not present

## 2015-10-16 DIAGNOSIS — M9903 Segmental and somatic dysfunction of lumbar region: Secondary | ICD-10-CM | POA: Diagnosis not present

## 2016-04-28 ENCOUNTER — Other Ambulatory Visit: Payer: Self-pay | Admitting: Family Medicine

## 2016-04-28 DIAGNOSIS — Z1231 Encounter for screening mammogram for malignant neoplasm of breast: Secondary | ICD-10-CM

## 2016-05-16 ENCOUNTER — Ambulatory Visit
Admission: RE | Admit: 2016-05-16 | Discharge: 2016-05-16 | Disposition: A | Discharge status: Home / Self Care | Payer: BLUE CROSS/BLUE SHIELD | Source: Ambulatory Visit | Attending: Family Medicine | Admitting: Family Medicine

## 2016-05-16 DIAGNOSIS — Z1231 Encounter for screening mammogram for malignant neoplasm of breast: Secondary | ICD-10-CM

## 2016-05-23 ENCOUNTER — Other Ambulatory Visit: Payer: Self-pay | Admitting: Family Medicine

## 2016-05-23 ENCOUNTER — Other Ambulatory Visit (HOSPITAL_COMMUNITY)
Admission: RE | Admit: 2016-05-23 | Discharge: 2016-05-23 | Disposition: A | Discharge status: Home / Self Care | Payer: BLUE CROSS/BLUE SHIELD | Source: Ambulatory Visit | Attending: Family Medicine | Admitting: Family Medicine

## 2016-05-23 DIAGNOSIS — Z124 Encounter for screening for malignant neoplasm of cervix: Secondary | ICD-10-CM | POA: Diagnosis present

## 2016-05-28 LAB — CYTOLOGY - PAP
Diagnosis: NEGATIVE
HPV (WINDOPATH): NOT DETECTED

## 2016-08-27 ENCOUNTER — Ambulatory Visit
Admission: RE | Admit: 2016-08-27 | Discharge: 2016-08-27 | Disposition: A | Discharge status: Home / Self Care | Payer: BLUE CROSS/BLUE SHIELD | Source: Ambulatory Visit | Attending: Physician Assistant | Admitting: Physician Assistant

## 2016-08-27 ENCOUNTER — Other Ambulatory Visit: Payer: Self-pay | Admitting: Physician Assistant

## 2016-08-27 DIAGNOSIS — R05 Cough: Secondary | ICD-10-CM | POA: Diagnosis not present

## 2016-08-27 DIAGNOSIS — R059 Cough, unspecified: Secondary | ICD-10-CM

## 2016-09-26 DIAGNOSIS — D122 Benign neoplasm of ascending colon: Secondary | ICD-10-CM | POA: Diagnosis not present

## 2016-09-26 DIAGNOSIS — Z1211 Encounter for screening for malignant neoplasm of colon: Secondary | ICD-10-CM | POA: Diagnosis not present

## 2016-09-30 DIAGNOSIS — D122 Benign neoplasm of ascending colon: Secondary | ICD-10-CM | POA: Diagnosis not present

## 2016-09-30 DIAGNOSIS — Z1211 Encounter for screening for malignant neoplasm of colon: Secondary | ICD-10-CM | POA: Diagnosis not present

## 2016-11-19 ENCOUNTER — Ambulatory Visit: Payer: Self-pay

## 2016-11-19 NOTE — Telephone Encounter (Signed)
Pt. States that she will have a friend take her to ED this morning, although she is reluctant. Informed of importance of being evaluated and not ignoring symptoms. Verbalizes understanding.

## 2016-11-19 NOTE — Telephone Encounter (Signed)
  Reason for Disposition . [1] Intermittent  chest pain or "angina" AND [2] increasing in severity or frequency  (Exception: pains lasting a few seconds)  Answer Assessment - Initial Assessment Questions 1. LOCATION: "Where does it hurt?"       Left side behind breast 2. RADIATION: "Does the pain go anywhere else?" (e.g., into neck, jaw, arms, back)     No 3. ONSET: "When did the chest pain begin?" (Minutes, hours or days)      Last night; heart felt like it was racing 4. PATTERN "Does the pain come and go, or has it been constant since it started?"  "Does it get worse with exertion?"      Constant 5. DURATION: "How long does it last" (e.g., seconds, minutes, hours)     Constant 6. SEVERITY: "How bad is the pain?"  (e.g., Scale 1-10; mild, moderate, or severe)    - MILD (1-3): doesn't interfere with normal activities     - MODERATE (4-7): interferes with normal activities or awakens from sleep    - SEVERE (8-10): excruciating pain, unable to do any normal activities       4 7. CARDIAC RISK FACTORS: "Do you have any history of heart problems or risk factors for heart disease?" (e.g., prior heart attack, angina; high blood pressure, diabetes, being overweight, high cholesterol, smoking, or strong family history of heart disease)     nO 8. PULMONARY RISK FACTORS: "Do you have any history of lung disease?"  (e.g., blood clots in lung, asthma, emphysema, birth control pills)     Pneumothorax in the past 9. CAUSE: "What do you think is causing the chest pain?"     Unsure 10. OTHER SYMPTOMS: "Do you have any other symptoms?" (e.g., dizziness, nausea, vomiting, sweating, fever, difficulty breathing, cough)       Nausea 11. PREGNANCY: "Is there any chance you are pregnant?" "When was your last menstrual period?"       No  Protocols used: CHEST PAIN-A-AH Pt. Does not want to go to ED. Encouraged to do so.

## 2017-06-19 ENCOUNTER — Other Ambulatory Visit: Payer: Self-pay | Admitting: Family Medicine

## 2017-06-19 DIAGNOSIS — Z1231 Encounter for screening mammogram for malignant neoplasm of breast: Secondary | ICD-10-CM

## 2017-06-26 DIAGNOSIS — Z1509 Genetic susceptibility to other malignant neoplasm: Secondary | ICD-10-CM | POA: Diagnosis not present

## 2017-06-26 DIAGNOSIS — Z0000 Encounter for general adult medical examination without abnormal findings: Secondary | ICD-10-CM | POA: Diagnosis not present

## 2017-06-26 DIAGNOSIS — M25511 Pain in right shoulder: Secondary | ICD-10-CM | POA: Diagnosis not present

## 2017-06-26 DIAGNOSIS — E041 Nontoxic single thyroid nodule: Secondary | ICD-10-CM | POA: Diagnosis not present

## 2017-06-26 DIAGNOSIS — Z136 Encounter for screening for cardiovascular disorders: Secondary | ICD-10-CM | POA: Diagnosis not present

## 2017-06-26 DIAGNOSIS — R002 Palpitations: Secondary | ICD-10-CM | POA: Diagnosis not present

## 2017-07-10 ENCOUNTER — Ambulatory Visit
Admission: RE | Admit: 2017-07-10 | Discharge: 2017-07-10 | Disposition: A | Discharge status: Home / Self Care | Payer: BLUE CROSS/BLUE SHIELD | Source: Ambulatory Visit | Attending: Family Medicine | Admitting: Family Medicine

## 2017-07-10 DIAGNOSIS — Z1231 Encounter for screening mammogram for malignant neoplasm of breast: Secondary | ICD-10-CM | POA: Diagnosis not present

## 2017-07-13 DIAGNOSIS — M25511 Pain in right shoulder: Secondary | ICD-10-CM | POA: Diagnosis not present

## 2017-07-13 DIAGNOSIS — M542 Cervicalgia: Secondary | ICD-10-CM | POA: Diagnosis not present

## 2017-07-13 DIAGNOSIS — M6281 Muscle weakness (generalized): Secondary | ICD-10-CM | POA: Diagnosis not present

## 2017-07-15 ENCOUNTER — Other Ambulatory Visit: Payer: Self-pay | Admitting: Family Medicine

## 2017-07-15 DIAGNOSIS — M542 Cervicalgia: Secondary | ICD-10-CM | POA: Diagnosis not present

## 2017-07-15 DIAGNOSIS — M25511 Pain in right shoulder: Secondary | ICD-10-CM | POA: Diagnosis not present

## 2017-07-15 DIAGNOSIS — Z1509 Genetic susceptibility to other malignant neoplasm: Secondary | ICD-10-CM

## 2017-07-15 DIAGNOSIS — M6281 Muscle weakness (generalized): Secondary | ICD-10-CM | POA: Diagnosis not present

## 2017-07-21 ENCOUNTER — Ambulatory Visit
Admission: RE | Admit: 2017-07-21 | Discharge: 2017-07-21 | Disposition: A | Discharge status: Home / Self Care | Payer: BLUE CROSS/BLUE SHIELD | Source: Ambulatory Visit | Attending: Family Medicine | Admitting: Family Medicine

## 2017-07-21 DIAGNOSIS — Q8789 Other specified congenital malformation syndromes, not elsewhere classified: Secondary | ICD-10-CM | POA: Diagnosis not present

## 2017-07-21 DIAGNOSIS — Z1509 Genetic susceptibility to other malignant neoplasm: Secondary | ICD-10-CM

## 2017-07-21 DIAGNOSIS — M25511 Pain in right shoulder: Secondary | ICD-10-CM | POA: Diagnosis not present

## 2017-07-21 DIAGNOSIS — M6281 Muscle weakness (generalized): Secondary | ICD-10-CM | POA: Diagnosis not present

## 2017-07-21 DIAGNOSIS — M542 Cervicalgia: Secondary | ICD-10-CM | POA: Diagnosis not present

## 2017-07-23 DIAGNOSIS — M542 Cervicalgia: Secondary | ICD-10-CM | POA: Diagnosis not present

## 2017-07-23 DIAGNOSIS — M25511 Pain in right shoulder: Secondary | ICD-10-CM | POA: Diagnosis not present

## 2017-07-23 DIAGNOSIS — M6281 Muscle weakness (generalized): Secondary | ICD-10-CM | POA: Diagnosis not present

## 2018-01-13 HISTORY — PX: PARS PLANA VITRECTOMY W/ REPAIR OF MACULAR HOLE: SHX2170

## 2018-01-13 HISTORY — PX: EYE SURGERY: SHX253

## 2018-05-31 DIAGNOSIS — Z03818 Encounter for observation for suspected exposure to other biological agents ruled out: Secondary | ICD-10-CM | POA: Diagnosis not present

## 2018-07-06 DIAGNOSIS — Z0000 Encounter for general adult medical examination without abnormal findings: Secondary | ICD-10-CM | POA: Diagnosis not present

## 2018-07-06 DIAGNOSIS — Z136 Encounter for screening for cardiovascular disorders: Secondary | ICD-10-CM | POA: Diagnosis not present

## 2018-07-06 DIAGNOSIS — E041 Nontoxic single thyroid nodule: Secondary | ICD-10-CM | POA: Diagnosis not present

## 2018-07-06 DIAGNOSIS — Z1509 Genetic susceptibility to other malignant neoplasm: Secondary | ICD-10-CM | POA: Diagnosis not present

## 2018-07-21 ENCOUNTER — Other Ambulatory Visit: Payer: Self-pay | Admitting: Family Medicine

## 2018-07-21 DIAGNOSIS — Z1509 Genetic susceptibility to other malignant neoplasm: Secondary | ICD-10-CM

## 2018-08-05 ENCOUNTER — Ambulatory Visit
Admission: RE | Admit: 2018-08-05 | Discharge: 2018-08-05 | Disposition: A | Discharge status: Home / Self Care | Payer: BLUE CROSS/BLUE SHIELD | Source: Ambulatory Visit | Attending: Family Medicine | Admitting: Family Medicine

## 2018-08-05 ENCOUNTER — Other Ambulatory Visit: Payer: BLUE CROSS/BLUE SHIELD

## 2018-08-05 DIAGNOSIS — K7689 Other specified diseases of liver: Secondary | ICD-10-CM | POA: Diagnosis not present

## 2018-08-05 DIAGNOSIS — Z1509 Genetic susceptibility to other malignant neoplasm: Secondary | ICD-10-CM

## 2018-08-05 MED ORDER — GADOBENATE DIMEGLUMINE 529 MG/ML IV SOLN
17.0000 mL | Freq: Once | INTRAVENOUS | Status: AC | PRN
  Administered 2018-08-05: 12:00:00 17 mL via INTRAVENOUS

## 2018-08-12 ENCOUNTER — Other Ambulatory Visit: Payer: BLUE CROSS/BLUE SHIELD

## 2018-08-13 DIAGNOSIS — H35341 Macular cyst, hole, or pseudohole, right eye: Secondary | ICD-10-CM | POA: Diagnosis not present

## 2018-08-16 DIAGNOSIS — H35341 Macular cyst, hole, or pseudohole, right eye: Secondary | ICD-10-CM | POA: Diagnosis not present

## 2018-08-16 DIAGNOSIS — H2513 Age-related nuclear cataract, bilateral: Secondary | ICD-10-CM | POA: Diagnosis not present

## 2018-08-16 DIAGNOSIS — H43813 Vitreous degeneration, bilateral: Secondary | ICD-10-CM | POA: Diagnosis not present

## 2018-09-15 DIAGNOSIS — H35341 Macular cyst, hole, or pseudohole, right eye: Secondary | ICD-10-CM | POA: Diagnosis not present

## 2018-10-18 DIAGNOSIS — H35341 Macular cyst, hole, or pseudohole, right eye: Secondary | ICD-10-CM | POA: Diagnosis not present

## 2018-12-14 ENCOUNTER — Other Ambulatory Visit: Payer: Self-pay | Admitting: Family Medicine

## 2018-12-14 DIAGNOSIS — Z1231 Encounter for screening mammogram for malignant neoplasm of breast: Secondary | ICD-10-CM

## 2019-02-04 ENCOUNTER — Ambulatory Visit
Admission: RE | Admit: 2019-02-04 | Discharge: 2019-02-04 | Disposition: A | Discharge status: Home / Self Care | Payer: BLUE CROSS/BLUE SHIELD | Source: Ambulatory Visit | Attending: Family Medicine | Admitting: Family Medicine

## 2019-02-04 ENCOUNTER — Other Ambulatory Visit: Payer: Self-pay

## 2019-02-04 DIAGNOSIS — Z1231 Encounter for screening mammogram for malignant neoplasm of breast: Secondary | ICD-10-CM | POA: Diagnosis not present

## 2019-02-08 DIAGNOSIS — H25041 Posterior subcapsular polar age-related cataract, right eye: Secondary | ICD-10-CM | POA: Diagnosis not present

## 2019-02-08 DIAGNOSIS — H43812 Vitreous degeneration, left eye: Secondary | ICD-10-CM | POA: Diagnosis not present

## 2019-02-08 DIAGNOSIS — H35341 Macular cyst, hole, or pseudohole, right eye: Secondary | ICD-10-CM | POA: Diagnosis not present

## 2019-05-16 DIAGNOSIS — H43812 Vitreous degeneration, left eye: Secondary | ICD-10-CM | POA: Diagnosis not present

## 2019-05-16 DIAGNOSIS — H524 Presbyopia: Secondary | ICD-10-CM | POA: Diagnosis not present

## 2019-05-16 DIAGNOSIS — H5213 Myopia, bilateral: Secondary | ICD-10-CM | POA: Diagnosis not present

## 2019-05-16 DIAGNOSIS — H35341 Macular cyst, hole, or pseudohole, right eye: Secondary | ICD-10-CM | POA: Diagnosis not present

## 2019-06-22 DIAGNOSIS — H25013 Cortical age-related cataract, bilateral: Secondary | ICD-10-CM | POA: Diagnosis not present

## 2019-06-22 DIAGNOSIS — H43392 Other vitreous opacities, left eye: Secondary | ICD-10-CM | POA: Diagnosis not present

## 2019-06-22 DIAGNOSIS — H2511 Age-related nuclear cataract, right eye: Secondary | ICD-10-CM | POA: Diagnosis not present

## 2019-06-22 DIAGNOSIS — H2513 Age-related nuclear cataract, bilateral: Secondary | ICD-10-CM | POA: Diagnosis not present

## 2019-06-22 DIAGNOSIS — H25043 Posterior subcapsular polar age-related cataract, bilateral: Secondary | ICD-10-CM | POA: Diagnosis not present

## 2019-07-05 DIAGNOSIS — H25042 Posterior subcapsular polar age-related cataract, left eye: Secondary | ICD-10-CM | POA: Diagnosis not present

## 2019-07-05 DIAGNOSIS — H2511 Age-related nuclear cataract, right eye: Secondary | ICD-10-CM | POA: Diagnosis not present

## 2019-07-05 DIAGNOSIS — H25012 Cortical age-related cataract, left eye: Secondary | ICD-10-CM | POA: Diagnosis not present

## 2019-07-05 DIAGNOSIS — H2512 Age-related nuclear cataract, left eye: Secondary | ICD-10-CM | POA: Diagnosis not present

## 2019-07-05 DIAGNOSIS — H25811 Combined forms of age-related cataract, right eye: Secondary | ICD-10-CM | POA: Diagnosis not present

## 2019-07-07 ENCOUNTER — Other Ambulatory Visit (HOSPITAL_COMMUNITY)
Admission: RE | Admit: 2019-07-07 | Discharge: 2019-07-07 | Disposition: A | Discharge status: Home / Self Care | Payer: BLUE CROSS/BLUE SHIELD | Source: Ambulatory Visit | Attending: Family Medicine | Admitting: Family Medicine

## 2019-07-07 ENCOUNTER — Other Ambulatory Visit: Payer: Self-pay | Admitting: Family Medicine

## 2019-07-07 DIAGNOSIS — E041 Nontoxic single thyroid nodule: Secondary | ICD-10-CM | POA: Diagnosis not present

## 2019-07-07 DIAGNOSIS — Z124 Encounter for screening for malignant neoplasm of cervix: Secondary | ICD-10-CM | POA: Diagnosis not present

## 2019-07-07 DIAGNOSIS — J301 Allergic rhinitis due to pollen: Secondary | ICD-10-CM | POA: Diagnosis not present

## 2019-07-07 DIAGNOSIS — Z13220 Encounter for screening for lipoid disorders: Secondary | ICD-10-CM | POA: Diagnosis not present

## 2019-07-07 DIAGNOSIS — Z0000 Encounter for general adult medical examination without abnormal findings: Secondary | ICD-10-CM | POA: Diagnosis not present

## 2019-07-07 DIAGNOSIS — J939 Pneumothorax, unspecified: Secondary | ICD-10-CM | POA: Diagnosis not present

## 2019-07-07 DIAGNOSIS — N959 Unspecified menopausal and perimenopausal disorder: Secondary | ICD-10-CM | POA: Diagnosis not present

## 2019-07-07 DIAGNOSIS — Z131 Encounter for screening for diabetes mellitus: Secondary | ICD-10-CM | POA: Diagnosis not present

## 2019-07-11 DIAGNOSIS — H2511 Age-related nuclear cataract, right eye: Secondary | ICD-10-CM | POA: Diagnosis not present

## 2019-07-11 LAB — CYTOLOGY - PAP: Diagnosis: NEGATIVE

## 2019-07-12 DIAGNOSIS — H2512 Age-related nuclear cataract, left eye: Secondary | ICD-10-CM | POA: Diagnosis not present

## 2020-02-27 ENCOUNTER — Ambulatory Visit
Admission: RE | Admit: 2020-02-27 | Discharge: 2020-02-27 | Disposition: A | Discharge status: Home / Self Care | Payer: BLUE CROSS/BLUE SHIELD | Source: Ambulatory Visit | Attending: Family Medicine | Admitting: Family Medicine

## 2020-02-27 ENCOUNTER — Other Ambulatory Visit: Payer: Self-pay | Admitting: Family Medicine

## 2020-02-27 ENCOUNTER — Other Ambulatory Visit: Payer: Self-pay

## 2020-02-27 DIAGNOSIS — Z1231 Encounter for screening mammogram for malignant neoplasm of breast: Secondary | ICD-10-CM

## 2020-03-28 DIAGNOSIS — S91312A Laceration without foreign body, left foot, initial encounter: Secondary | ICD-10-CM | POA: Diagnosis not present

## 2020-04-06 DIAGNOSIS — S91319A Laceration without foreign body, unspecified foot, initial encounter: Secondary | ICD-10-CM | POA: Diagnosis not present

## 2020-04-06 DIAGNOSIS — Z4802 Encounter for removal of sutures: Secondary | ICD-10-CM | POA: Diagnosis not present

## 2020-04-13 DIAGNOSIS — L578 Other skin changes due to chronic exposure to nonionizing radiation: Secondary | ICD-10-CM | POA: Diagnosis not present

## 2020-04-13 DIAGNOSIS — L738 Other specified follicular disorders: Secondary | ICD-10-CM | POA: Diagnosis not present

## 2020-04-13 DIAGNOSIS — L570 Actinic keratosis: Secondary | ICD-10-CM | POA: Diagnosis not present

## 2020-04-13 DIAGNOSIS — L814 Other melanin hyperpigmentation: Secondary | ICD-10-CM | POA: Diagnosis not present

## 2020-08-06 DIAGNOSIS — E041 Nontoxic single thyroid nodule: Secondary | ICD-10-CM | POA: Diagnosis not present

## 2020-08-06 DIAGNOSIS — Z0000 Encounter for general adult medical examination without abnormal findings: Secondary | ICD-10-CM | POA: Diagnosis not present

## 2020-08-06 DIAGNOSIS — Z13220 Encounter for screening for lipoid disorders: Secondary | ICD-10-CM | POA: Diagnosis not present

## 2020-08-06 DIAGNOSIS — Z1509 Genetic susceptibility to other malignant neoplasm: Secondary | ICD-10-CM | POA: Diagnosis not present

## 2020-08-14 ENCOUNTER — Other Ambulatory Visit: Payer: Self-pay | Admitting: Family Medicine

## 2020-08-14 DIAGNOSIS — Z1509 Genetic susceptibility to other malignant neoplasm: Secondary | ICD-10-CM

## 2020-08-20 DIAGNOSIS — N959 Unspecified menopausal and perimenopausal disorder: Secondary | ICD-10-CM | POA: Diagnosis not present

## 2020-08-30 ENCOUNTER — Ambulatory Visit
Admission: RE | Admit: 2020-08-30 | Discharge: 2020-08-30 | Disposition: A | Discharge status: Home / Self Care | Payer: BLUE CROSS/BLUE SHIELD | Source: Ambulatory Visit | Attending: Family Medicine | Admitting: Family Medicine

## 2020-08-30 DIAGNOSIS — K7689 Other specified diseases of liver: Secondary | ICD-10-CM | POA: Diagnosis not present

## 2020-08-30 DIAGNOSIS — C649 Malignant neoplasm of unspecified kidney, except renal pelvis: Secondary | ICD-10-CM | POA: Diagnosis not present

## 2020-08-30 DIAGNOSIS — Z1509 Genetic susceptibility to other malignant neoplasm: Secondary | ICD-10-CM

## 2020-08-30 DIAGNOSIS — K8020 Calculus of gallbladder without cholecystitis without obstruction: Secondary | ICD-10-CM | POA: Diagnosis not present

## 2020-08-30 MED ORDER — GADOBENATE DIMEGLUMINE 529 MG/ML IV SOLN
18.0000 mL | Freq: Once | INTRAVENOUS | Status: AC | PRN
  Administered 2020-08-30: 18 mL via INTRAVENOUS

## 2020-09-06 DIAGNOSIS — R350 Frequency of micturition: Secondary | ICD-10-CM | POA: Diagnosis not present

## 2020-09-08 ENCOUNTER — Emergency Department (HOSPITAL_COMMUNITY)
Admission: EM | Admit: 2020-09-08 | Discharge: 2020-09-08 | Disposition: A | Discharge status: Home / Self Care | Payer: BLUE CROSS/BLUE SHIELD | Attending: Emergency Medicine | Admitting: Emergency Medicine

## 2020-09-08 ENCOUNTER — Other Ambulatory Visit: Payer: Self-pay

## 2020-09-08 ENCOUNTER — Encounter (HOSPITAL_COMMUNITY): Payer: Self-pay

## 2020-09-08 ENCOUNTER — Emergency Department (HOSPITAL_COMMUNITY): Payer: BLUE CROSS/BLUE SHIELD

## 2020-09-08 DIAGNOSIS — R100 Acute abdomen: Secondary | ICD-10-CM | POA: Diagnosis not present

## 2020-09-08 DIAGNOSIS — K8020 Calculus of gallbladder without cholecystitis without obstruction: Secondary | ICD-10-CM | POA: Insufficient documentation

## 2020-09-08 DIAGNOSIS — R1031 Right lower quadrant pain: Secondary | ICD-10-CM

## 2020-09-08 DIAGNOSIS — E875 Hyperkalemia: Secondary | ICD-10-CM

## 2020-09-08 DIAGNOSIS — E860 Dehydration: Secondary | ICD-10-CM | POA: Diagnosis not present

## 2020-09-08 DIAGNOSIS — K8080 Other cholelithiasis without obstruction: Secondary | ICD-10-CM | POA: Diagnosis not present

## 2020-09-08 LAB — CBC WITH DIFFERENTIAL/PLATELET
Abs Immature Granulocytes: 0.03 10*3/uL (ref 0.00–0.07)
Basophils Absolute: 0 10*3/uL (ref 0.0–0.1)
Basophils Relative: 1 %
Eosinophils Absolute: 0.2 10*3/uL (ref 0.0–0.5)
Eosinophils Relative: 2 %
HCT: 45.3 % (ref 36.0–46.0)
Hemoglobin: 14.9 g/dL (ref 12.0–15.0)
Immature Granulocytes: 0 %
Lymphocytes Relative: 27 %
Lymphs Abs: 2.4 10*3/uL (ref 0.7–4.0)
MCH: 31.9 pg (ref 26.0–34.0)
MCHC: 32.9 g/dL (ref 30.0–36.0)
MCV: 97 fL (ref 80.0–100.0)
Monocytes Absolute: 0.8 10*3/uL (ref 0.1–1.0)
Monocytes Relative: 9 %
Neutro Abs: 5.4 10*3/uL (ref 1.7–7.7)
Neutrophils Relative %: 61 %
Platelets: 283 10*3/uL (ref 150–400)
RBC: 4.67 MIL/uL (ref 3.87–5.11)
RDW: 12.3 % (ref 11.5–15.5)
WBC: 8.8 10*3/uL (ref 4.0–10.5)
nRBC: 0 % (ref 0.0–0.2)

## 2020-09-08 LAB — URINALYSIS, ROUTINE W REFLEX MICROSCOPIC
Bilirubin Urine: NEGATIVE
Glucose, UA: NEGATIVE mg/dL
Hgb urine dipstick: NEGATIVE
Ketones, ur: 20 mg/dL — AB
Leukocytes,Ua: NEGATIVE
Nitrite: NEGATIVE
Protein, ur: NEGATIVE mg/dL
Specific Gravity, Urine: 1.004 — ABNORMAL LOW (ref 1.005–1.030)
pH: 6 (ref 5.0–8.0)

## 2020-09-08 LAB — COMPREHENSIVE METABOLIC PANEL
ALT: 21 U/L (ref 0–44)
AST: 35 U/L (ref 15–41)
Albumin: 3.6 g/dL (ref 3.5–5.0)
Alkaline Phosphatase: 25 U/L — ABNORMAL LOW (ref 38–126)
Anion gap: 4 — ABNORMAL LOW (ref 5–15)
BUN: 9 mg/dL (ref 6–20)
CO2: 23 mmol/L (ref 22–32)
Calcium: 8.5 mg/dL — ABNORMAL LOW (ref 8.9–10.3)
Chloride: 110 mmol/L (ref 98–111)
Creatinine, Ser: 0.91 mg/dL (ref 0.44–1.00)
GFR, Estimated: >60 mL/min (ref >60)
Glucose, Bld: 78 mg/dL (ref 70–99)
Potassium: 5.6 mmol/L — ABNORMAL HIGH (ref 3.5–5.1)
Sodium: 137 mmol/L (ref 135–145)
Total Bilirubin: 1 mg/dL (ref 0.3–1.2)
Total Protein: 6.9 g/dL (ref 6.5–8.1)

## 2020-09-08 LAB — HCG, SERUM, QUALITATIVE: Preg, Serum: NEGATIVE

## 2020-09-08 LAB — TYPE AND SCREEN
ABO/RH(D): B POS
Antibody Screen: NEGATIVE

## 2020-09-08 LAB — LIPASE, BLOOD: Lipase: 32 U/L (ref 11–51)

## 2020-09-08 MED ORDER — ONDANSETRON HCL 4 MG PO TABS: 4.0000 mg | ORAL_TABLET | Freq: Four times a day (QID) | ORAL | 0 refills | Status: DC

## 2020-09-08 MED ORDER — SODIUM ZIRCONIUM CYCLOSILICATE 10 G PO PACK
10.0000 g | PACK | Freq: Once | ORAL | Status: AC
  Administered 2020-09-08: 10 g via ORAL
  Filled 2020-09-08: qty 1

## 2020-09-08 MED ORDER — LOKELMA 10 G PO PACK: 10.0000 g | PACK | Freq: Once | ORAL | 0 refills | Status: AC

## 2020-09-08 MED ORDER — SODIUM CHLORIDE 0.9 % IV BOLUS
1000.0000 mL | Freq: Once | INTRAVENOUS | Status: AC
  Administered 2020-09-08: 1000 mL via INTRAVENOUS

## 2020-09-08 MED ORDER — DICYCLOMINE HCL 20 MG PO TABS: 20.0000 mg | ORAL_TABLET | Freq: Two times a day (BID) | ORAL | 0 refills | Status: DC | PRN

## 2020-09-08 MED ORDER — MORPHINE SULFATE (PF) 4 MG/ML IV SOLN
4.0000 mg | Freq: Once | INTRAVENOUS | Status: AC
  Administered 2020-09-08: 4 mg via INTRAVENOUS
  Filled 2020-09-08: qty 1

## 2020-09-08 MED ORDER — IOHEXOL 350 MG/ML SOLN
80.0000 mL | Freq: Once | INTRAVENOUS | Status: AC | PRN
  Administered 2020-09-08: 80 mL via INTRAVENOUS

## 2020-09-08 MED ORDER — ONDANSETRON HCL 4 MG/2ML IJ SOLN
4.0000 mg | Freq: Once | INTRAMUSCULAR | Status: AC
  Administered 2020-09-08: 4 mg via INTRAVENOUS
  Filled 2020-09-08: qty 2

## 2020-09-08 MED ORDER — SODIUM CHLORIDE 0.9 % IV BOLUS: 1000.0000 mL | Freq: Once | INTRAVENOUS | Status: DC

## 2020-09-08 MED ORDER — SUCRALFATE 1 G PO TABS: 1.0000 g | ORAL_TABLET | Freq: Three times a day (TID) | ORAL | 0 refills | Status: DC

## 2020-09-08 NOTE — ED Triage Notes (Addendum)
Pt reports RLQ pain that occasionally radiates to her back and down her leg that began Wednesday. Pt went to UC and sent here to rule out appendicitis.

## 2020-09-08 NOTE — Discharge Instructions (Addendum)
The etiology of your abdominal pain is unclear.    There are many causes of abdominal pain. Most pain is not serious and goes away, but some pain gets worse, changes, or will not go away. Please return to the emergency department or see your doctor right away if you (or your family member) experience any of the following:  1. Pain that gets worse or moves to just one spot.  2. Pain that gets worse if you cough or sneeze.  3. Pain with going over a bump in the road.  4. Pain that does not get better in 24 hours.  5. Inability to keep down liquids (vomiting)-especially if you are making less urine.  6. Fainting.  7. Blood in the vomit or stool.  8. High fever or shaking chills.  9. Swelling of the abdomen.  10. Any new or worsening problem.      Follow-up Instructions  Return to the emergency department in 8-12 hours for recheck if worse.  See your primary care provider if not completely better in the next 2-3 days. Come to the ED if you are unable to see them in this time frame.    Additional Instructions  No alcohol.  No caffeine, aspirin, or cigarettes.   Please return to the emergency department immediately for any new or concerning symptoms, or if you get worse.

## 2020-09-08 NOTE — ED Notes (Signed)
Pt d/c home per MD order. Discharge summary reviewed with pt, pt verbalizes understanding. No s/s of acute distress noted at discharge. Ambulatory off unit. Pt husband is discharge ride home.

## 2020-09-08 NOTE — ED Provider Notes (Signed)
[facility] DEPT Provider Note   CSN: [id number] Arrival date & time: 09/08/20  1419     History Chief Complaint  Patient presents with   Abdominal Pain    Kim Garner is a 55 y.o. female.  This is 55 year old female presented ER secondary to abdominal pain.  Onset pain approximately 4 days ago.  Initially began in her periumbilical region and is now rating to her right lower quadrant.  Pain is constant.  Described as sharp, stabbing.  The past 24 hours she has had very poor appetite and not eating since yesterday afternoon.  She is tolerating liquids appropriately.  No fevers or chills.  Reports some remittent burning with urination although she did receive urinalysis by her PCP few days ago which was negative for UTI.  She was started on Keflex by PCP.  Abdominal surgery includes ovarian cyst removal approximately 20 years ago.  Last PO approximately yesterday afternoon.  No concern for STI exposure.  The history is provided by the patient. No language interpreter was used.  Abdominal Pain Associated symptoms: no chest pain, no chills, no cough, no diarrhea, no fever, no hematuria, no nausea, no shortness of breath, no vaginal bleeding and no vomiting       Past Medical History:  Diagnosis Date   Arthritis    Contact lens/glasses fitting    wears contacts or glasses   Loose body of left elbow 12/21/2012   Lung, cysts, congenital    Pneumothorax 1997   Pneumothorax in 1997 and 1999    Patient Active Problem List   Diagnosis Date Noted   Loose body of left elbow 12/21/2012    Past Surgical History:  Procedure Laterality Date   BREAST BIOPSY Right 12/24/2010   benign   CHEST TUBE INSERTION  [id number]   lt spont pneumo   ELBOW ARTHROSCOPY Left 12/21/2012   Procedure: LEFT ARTHROSCOPY ELBOW;  Surgeon: Johnny Bridge, MD;  Location: [facility];  Service: Orthopedics;  Laterality: Left;   FOREIGN BODY REMOVAL Left 12/21/2012    Procedure: LEFT ELBOW REMOVAL LOSE BODY ;  Surgeon: Johnny Bridge, MD;  Location: [facility];  Service: Orthopedics;  Laterality: Left;   LUMBAR LAMINECTOMY  2007   ORIF ELBOW FRACTURE  1990   left   OVARY SURGERY  2001   ovarian cyst   PLEURAL SCARIFICATION  1999   surgery to correct spont peumos-left     OB History   No obstetric history on file.     Family History  Problem Relation Age of Onset   Lung disease Maternal Uncle        pneumothorax in 74s   Lung disease Cousin 30       pneumothorax   Lung disease Other        Birt Hogg Dube syndrome   Breast cancer Maternal Grandmother     Social History   Tobacco Use   Smoking status: Never   Smokeless tobacco: Never  Substance Use Topics   Alcohol use: Yes   Drug use: No    Home Medications Prior to Admission medications   Medication Sig Start Date End Date Taking? Authorizing Provider  Ascorbic Acid (VITAMIN C) 1000 MG tablet Take 1,000 mg by mouth daily.   Yes [provider]  cephALEXin (KEFLEX) 500 MG capsule Take 500 mg by mouth 2 (two) times daily. 09/06/20  Yes [provider]  dicyclomine (BENTYL) 20 MG tablet Take 1 tablet (  20 mg total) by mouth 2 (two) times daily as needed for spasms. 09/08/20  Yes Jeanell Sparrow, DO  diphenhydrAMINE (SOMINEX) 25 MG tablet Take 25 mg by mouth at bedtime as needed for sleep.   Yes [provider]  estradiol (VIVELLE-DOT) 0.05 MG/24HR patch Place 1 patch onto the skin 2 (two) times a week. 09/03/20  Yes [provider]  fluticasone (FLONASE) 50 MCG/ACT nasal spray Place 1 spray into both nostrils daily.   Yes [provider]  ibuprofen (ADVIL) 200 MG tablet Take 600 mg by mouth every 6 (six) hours as needed for moderate pain.   Yes [provider]  Lactobacillus (AZO COMPLETE FEMININE BALANCE PO) Take 2 tablets by mouth daily as needed (uti).   Yes [provider]  ondansetron (ZOFRAN) 4 MG tablet Take  1 tablet (4 mg total) by mouth every 6 (six) hours. 09/08/20  Yes Jeanell Sparrow, DO  progesterone (PROMETRIUM) 100 MG capsule Take 100 mg by mouth at bedtime. 09/06/20  Yes [provider]  sodium zirconium cyclosilicate (LOKELMA) 10 g PACK packet Take 10 g by mouth once for 1 dose. 09/08/20 09/08/20 Yes Wynona Dove A, DO  sucralfate (CARAFATE) 1 g tablet Take 1 tablet (1 g total) by mouth 4 (four) times daily -  with meals and at bedtime for 7 days. 09/08/20 09/15/20 Yes Wynona Dove A, DO  ketorolac (TORADOL) 10 MG tablet Take 1 tablet (10 mg total) by mouth every 6 (six) hours as needed. Patient not taking: No sig reported 12/21/12   Marchia Bond, MD  oxyCODONE-acetaminophen (PERCOCET) 10-325 MG per tablet Take 1-2 tablets by mouth every 6 (six) hours as needed for pain. MAXIMUM TOTAL ACETAMINOPHEN DOSE IS 4000 MG PER DAY Patient not taking: No sig reported 12/21/12   Marchia Bond, MD  promethazine (PHENERGAN) 25 MG tablet Take 1 tablet (25 mg total) by mouth every 6 (six) hours as needed for nausea or vomiting. Patient not taking: No sig reported 12/21/12   Marchia Bond, MD  sennosides-docusate sodium (SENOKOT-S) 8.6-50 MG tablet Take 2 tablets by mouth daily. Patient not taking: No sig reported 12/21/12   Marchia Bond, MD    Allergies    Pine and Sulfa antibiotics  Review of Systems   Review of Systems  Constitutional:  Positive for appetite change. Negative for chills and fever.  HENT:  Negative for facial swelling and trouble swallowing.   Eyes:  Negative for photophobia and visual disturbance.  Respiratory:  Negative for cough and shortness of breath.   Cardiovascular:  Negative for chest pain and palpitations.  Gastrointestinal:  Positive for abdominal pain. Negative for diarrhea, nausea and vomiting.  Endocrine: Negative for polydipsia and polyuria.  Genitourinary:  Negative for difficulty urinating, hematuria and vaginal bleeding.  Musculoskeletal:  Negative for gait  problem and joint swelling.  Skin:  Negative for pallor and rash.  Neurological:  Negative for syncope and headaches.  Psychiatric/Behavioral:  Negative for agitation and confusion.    Physical Exam Updated Vital Signs BP (!) 137/96   Pulse 73   Temp (!) 97.5 F (36.4 C) (Oral)   Resp 16   SpO2 100%   Physical Exam Vitals and nursing note reviewed.  Constitutional:      General: She is not in acute distress.    Appearance: Normal appearance.  HENT:     Head: Normocephalic and atraumatic.     Right Ear: External ear normal.     Left Ear: External ear normal.  Nose: Nose normal.     Mouth/Throat:     Mouth: Mucous membranes are moist.  Eyes:     General: No scleral icterus.       Right eye: No discharge.        Left eye: No discharge.  Cardiovascular:     Rate and Rhythm: Normal rate and regular rhythm.     Pulses: Normal pulses.     Heart sounds: Normal heart sounds.  Pulmonary:     Effort: Pulmonary effort is normal. No respiratory distress.     Breath sounds: Normal breath sounds.  Abdominal:     General: Abdomen is flat.     Palpations: Abdomen is soft.     Tenderness: There is abdominal tenderness. There is no right CVA tenderness or left CVA tenderness. Positive signs include McBurney's sign.       Comments: Nonperitoneal abdomen  Musculoskeletal:        General: Normal range of motion.     Cervical back: Normal range of motion.     Right lower leg: No edema.     Left lower leg: No edema.  Skin:    General: Skin is warm and dry.     Capillary Refill: Capillary refill takes less than 2 seconds.  Neurological:     Mental Status: She is alert.  Psychiatric:        Mood and Affect: Mood normal.        Behavior: Behavior normal.    ED Results / Procedures / Treatments   Labs (all labs ordered are listed, but only abnormal results are displayed) Labs Reviewed  URINALYSIS, ROUTINE W REFLEX MICROSCOPIC - Abnormal; Notable for the following components:       Result Value   Color, Urine STRAW (*)    Specific Gravity, Urine 1.004 (*)    Ketones, ur 20 (*)    All other components within normal limits  COMPREHENSIVE METABOLIC PANEL - Abnormal; Notable for the following components:   Potassium 5.6 (*)    Calcium 8.5 (*)    Alkaline Phosphatase 25 (*)    Anion gap 4 (*)    All other components within normal limits  CBC WITH DIFFERENTIAL/PLATELET  HCG, SERUM, QUALITATIVE  LIPASE, BLOOD  TYPE AND SCREEN  ABO/RH    EKG None  Radiology CT ABDOMEN PELVIS W CONTRAST  Result Date: 09/08/2020 CLINICAL DATA:  Intermittent right lower quadrant pain. EXAM: CT ABDOMEN AND PELVIS WITH CONTRAST TECHNIQUE: Multidetector CT imaging of the abdomen and pelvis was performed using the standard protocol following bolus administration of intravenous contrast. CONTRAST:  60m OMNIPAQUE IOHEXOL 350 MG/ML SOLN COMPARISON:  None. FINDINGS: Lower chest: Small, thin walled parenchymal cysts are seen within the bilateral lower lobes. Hepatobiliary: A 4.7 cm x 3.8 cm cystic appearing area is noted within the posterior aspect of the left lobe of the liver. Additional smaller cystic appearing areas are seen within the right and left lobes. Ill-defined gallstones are seen within a moderately distended gallbladder, without evidence of gallbladder wall thickening, pericholecystic inflammation or biliary dilatation. Pancreas: Unremarkable. No pancreatic ductal dilatation or surrounding inflammatory changes. Spleen: Normal in size without focal abnormality. Adrenals/Urinary Tract: Adrenal glands are unremarkable. Kidneys are normal, without renal calculi, focal lesion, or hydronephrosis. Bladder is unremarkable. Stomach/Bowel: Stomach is within normal limits. The appendix is poorly visualized. No evidence of bowel wall thickening, distention, or inflammatory changes. Vascular/Lymphatic: No significant vascular findings are present. No enlarged abdominal or pelvic lymph nodes.  Reproductive: Uterus and  bilateral adnexa are unremarkable. Other: No abdominal wall hernia or abnormality. No abdominopelvic ascites. Musculoskeletal: Marked severity degenerative changes are seen at the levels of L4-L5 and L5-S1. IMPRESSION: 1. Cholelithiasis without evidence of acute cholecystitis. 2. Multiple hepatic cysts. 3. Marked severity degenerative changes at the levels of L4-L5 and L5-S1. 4. Poorly visualized appendix without secondary signs of appendicitis. Correlation with follow-up abdomen pelvis CT following oral contrast is recommended if clinical symptoms persist and appendicitis remains of clinical concern. Electronically Signed   By: Virgina Norfolk M.D.   On: 09/08/2020 20:35    Procedures Procedures   Medications Ordered in ED Medications  sodium chloride 0.9 % bolus 1,000 mL (0 mLs Intravenous Stopped 09/08/20 1807)  morphine 4 MG/ML injection 4 mg (4 mg Intravenous Given 09/08/20 1541)  ondansetron (ZOFRAN) injection 4 mg (4 mg Intravenous Given 09/08/20 1540)  sodium zirconium cyclosilicate (LOKELMA) packet 10 g (10 g Oral Given 09/08/20 2019)  iohexol (OMNIPAQUE) 350 MG/ML injection 80 mL (80 mLs Intravenous Contrast Given 09/08/20 2026)    ED Course  I have reviewed the triage vital signs and the nursing notes.  Pertinent labs & imaging results that were available during my care of the patient were reviewed by me and considered in my medical decision making (see chart for details).    MDM Rules/Calculators/A&P   This patient complains of abdominal pain; this involves an extensive number of treatment Options and is a complaint that carries with it a high risk of complications and Morbidity.  Vital signs reviewed and are stable.  Afebrile.  Nontoxic-appearing.  Nonperitoneal abdomen.  Serious etiologies considered.   Labs reviewed, potassium is mildly increased.  5.6.  Given Lokelma.  Urinalysis with ketones.  Reduced oral intake over the past 24 hours.  IV fluids in  process.  CBC nonacute.  CT abdomen pelvis with IV contrast with some cholelithiasis without evidence of acute cholecystitis, hepatic cyst, degenerative changes of the lumbar spine.  No overt evidence of acute appendicitis.  Given patient's symptoms of been ongoing for approximately 4 days, stable vital signs, reassuring abdominal exam, lack of leukocytosis on CBC.  No evidence of sepsis at this time.  Ability to tolerate oral intake.  Acute appendicitis is less likely.  Patient reassessed.  States she is feeling much better this time.  She is ambulatory to the restroom.  On repeat exam abdomen is soft, nontender, nonperitoneal.  Patient is able to tolerate oral intake without difficulty, no ongoing nausea or vomiting. She is overall well appearing.   The patient's overall condition has improved, the patient presents with abdominal pain without signs of peritonitis, or other life-threatening serious etiology. The patient understands that at this time there is no evidence for a more malignant underlying process, but the patient also understands that early in the process of an illness, an emergency department workup can be falsely reassuring. Detailed discussions were had with the patient regarding current findings, and need for close f/u with PCP or on call doctor. The patient appears stable for discharge and has been instructed to return immediately if the symptoms worsen in any way for re-evaluation. Patient verbalized understanding and is in agreement with current care plan.  All questions answered prior to discharge.  Patient w/ mildly elevated potassium, given Lokelma.  Advised patient follow-up with her PCP in 48 hours for repeat potassium check. She is agreeable.    Final Clinical Impression(s) / ED Diagnoses Final diagnoses:  Mild dehydration  Right lower quadrant abdominal pain  Hyperkalemia  Biliary calculus of other site without obstruction    Rx / DC Orders ED Discharge Orders           Ordered    ondansetron (ZOFRAN) 4 MG tablet  Every 6 hours        09/08/20 2200    sucralfate (CARAFATE) 1 g tablet  3 times daily with meals & bedtime        09/08/20 2200    dicyclomine (BENTYL) 20 MG tablet  2 times daily PRN        09/08/20 2200    sodium zirconium cyclosilicate (LOKELMA) 10 g PACK packet   Once        09/08/20 [place], Staysha Truby A, DO 09/08/20 2212

## 2020-09-08 NOTE — ED Notes (Signed)
Pt transported to CT ?

## 2020-09-08 NOTE — ED Notes (Signed)
Pt provided water and crackers for PO challenge per MD order.

## 2020-09-10 DIAGNOSIS — E875 Hyperkalemia: Secondary | ICD-10-CM | POA: Diagnosis not present

## 2020-09-10 DIAGNOSIS — R399 Unspecified symptoms and signs involving the genitourinary system: Secondary | ICD-10-CM | POA: Diagnosis not present

## 2020-09-10 DIAGNOSIS — R1031 Right lower quadrant pain: Secondary | ICD-10-CM | POA: Diagnosis not present

## 2020-09-19 DIAGNOSIS — N939 Abnormal uterine and vaginal bleeding, unspecified: Secondary | ICD-10-CM | POA: Diagnosis not present

## 2020-09-19 DIAGNOSIS — N949 Unspecified condition associated with female genital organs and menstrual cycle: Secondary | ICD-10-CM | POA: Diagnosis not present

## 2020-09-19 DIAGNOSIS — R102 Pelvic and perineal pain: Secondary | ICD-10-CM | POA: Diagnosis not present

## 2020-09-19 DIAGNOSIS — N951 Menopausal and female climacteric states: Secondary | ICD-10-CM | POA: Diagnosis not present

## 2020-09-27 DIAGNOSIS — R102 Pelvic and perineal pain: Secondary | ICD-10-CM | POA: Diagnosis not present

## 2020-09-27 DIAGNOSIS — N939 Abnormal uterine and vaginal bleeding, unspecified: Secondary | ICD-10-CM | POA: Diagnosis not present

## 2020-10-02 ENCOUNTER — Other Ambulatory Visit: Payer: Self-pay | Admitting: Gastroenterology

## 2020-10-02 ENCOUNTER — Ambulatory Visit
Admission: RE | Admit: 2020-10-02 | Discharge: 2020-10-02 | Disposition: A | Discharge status: Home / Self Care | Payer: BLUE CROSS/BLUE SHIELD | Source: Ambulatory Visit | Attending: Gastroenterology | Admitting: Gastroenterology

## 2020-10-02 DIAGNOSIS — R109 Unspecified abdominal pain: Secondary | ICD-10-CM | POA: Diagnosis not present

## 2020-10-02 DIAGNOSIS — R1031 Right lower quadrant pain: Secondary | ICD-10-CM | POA: Diagnosis not present

## 2020-10-02 DIAGNOSIS — K5900 Constipation, unspecified: Secondary | ICD-10-CM

## 2020-10-02 DIAGNOSIS — K625 Hemorrhage of anus and rectum: Secondary | ICD-10-CM | POA: Diagnosis not present

## 2020-10-02 DIAGNOSIS — K649 Unspecified hemorrhoids: Secondary | ICD-10-CM | POA: Diagnosis not present

## 2020-10-19 DIAGNOSIS — N951 Menopausal and female climacteric states: Secondary | ICD-10-CM | POA: Diagnosis not present

## 2020-11-22 ENCOUNTER — Ambulatory Visit: Payer: Self-pay | Admitting: Surgery

## 2020-11-22 DIAGNOSIS — K8010 Calculus of gallbladder with chronic cholecystitis without obstruction: Secondary | ICD-10-CM | POA: Diagnosis not present

## 2020-11-22 NOTE — H&P (Signed)
Subjective    Chief Complaint: Gallbladder       History of Present Illness: Kim Garner is a 55 y.o. female who is seen today as an office consultation at the request of Dr. Leonie Green for evaluation of Gallbladder .     This is a 55 year old female who undergoes MRI of the abdomen every 2 years to screen for renal cell carcinoma.  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  Her most recent scan revealed some gallstones with some mild thickening of the gallbladder.  Over the last couple of months, she has had a constant low-grade right-sided abdominal pain with episodes of severe exacerbation.  She had 1 episode that involved significant pain radiating through into her right shoulder associated with nausea, vomiting, as well as diarrhea.  She reports a lot of abdominal bloating.  She did not correlate her symptoms completely to eating.  She was evaluated by GI.  Her last colonoscopy was only 4 years ago and showed only a small polyp.  She is referred to [place] to discuss cholecystectomy.     Review of Systems: A complete review of systems was obtained from the patient.  I have reviewed this information and discussed as appropriate with the patient.  See HPI as well for other ROS.   Review of Systems  Constitutional: Negative.   HENT: Negative.   Eyes: Negative.   Respiratory: Negative.   Cardiovascular: Negative.   Gastrointestinal: Positive for abdominal pain, diarrhea, nausea and vomiting.  Genitourinary: Negative.   Musculoskeletal: Positive for back pain.  Skin: Negative.   Neurological: Negative.   Endo/Heme/Allergies: Negative.   Psychiatric/Behavioral: Negative.         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 Mardelle Matte        Allergies      Allergies  Allergen Reactions   Sulfa (Sulfonamide Antibiotics) Nausea and Other (See Comments)              Current Outpatient Medications on File Prior to Visit  Medication Sig Dispense Refill   omeprazole (PRILOSEC) 20 MG DR capsule 1 capsule 30 minutes before morning meal       diphenhydrAMINE (BENADRYL) 25 mg capsule 1 capsule as needed       fluticasone propionate (FLONASE) 50 mcg/actuation nasal spray Place into one nostril       gabapentin (NEURONTIN) 300 MG capsule 1 capsule       ibuprofen (MOTRIN) 200 MG tablet Take by mouth        No current facility-administered medications on file prior to visit.      Family History       Family History  Problem Relation Age of Onset   High blood pressure (Hypertension) Mother     Deep vein thrombosis (DVT or abnormal blood clot formation) Father     High blood pressure (Hypertension) Father          Social History  Tobacco Use  Smoking Status Never  Smokeless Tobacco Never      Social History  Social History         Socioeconomic History   Marital status: Married  Tobacco Use   Smoking status: Never   Smokeless tobacco: Never  Substance and Sexual Activity   Alcohol use: Yes      Alcohol/week: 5.0 - 7.0 standard drinks      Types: 5 - 7 Glasses of wine per week   Drug use: Never        Objective:         Vitals:    11/22/20 1431  BP: 126/80  Pulse: 76  Temp: 36.5 C (97.7 F)  SpO2: 97%  Weight: 84.6 kg (186 lb 9.6 oz)  Height: 175.3 cm (5\' 9" )    Body mass index is 27.56 kg/m.   Physical Exam    Constitutional:  WDWN in NAD, conversant, no obvious deformities; lying in bed comfortably Eyes:  Pupils equal, round; sclera anicteric; moist conjunctiva; no lid lag HENT:  Oral  mucosa moist; good dentition  Neck:  No masses palpated, trachea midline; no thyromegaly Lungs:  CTA bilaterally; normal respiratory effort Breasts:  symmetric, no nipple changes; no palpable masses or lymphadenopathy on either side CV:  Regular rate and rhythm; no murmurs; extremities well-perfused with no edema Abd:  +bowel sounds, soft, mildly tender in RUQ, no palpable organomegaly; small umbilical hernia at the upper edge of the umbilicus Lymphatic:  No palpable cervical or axillary lymphadenopathy Skin:  Warm, dry; no sign of jaundice Psychiatric - alert and oriented x 4; calm mood and affect     Labs, Imaging and Diagnostic Testing: CLINICAL DATA:  Genetic susceptibility to malignant neoplasm/renal cell carcinoma.   EXAM: MRI ABDOMEN WITHOUT AND WITH CONTRAST   TECHNIQUE: Multiplanar multisequence MR imaging of the abdomen was performed both before and after the administration of intravenous contrast.   CONTRAST:  52mL MULTIHANCE GADOBENATE DIMEGLUMINE 529 MG/ML IV SOLN   COMPARISON:  Multiple priors including most recent MRI abdomen August 05, 2018   FINDINGS: Lower chest: No acute findings.   Hepatobiliary: Bilobar hepatic cysts are again demonstrated with the largest cyst projecting off of the left hepatic lobe demonstrating some internal septations which it appears to extend from a macro lobulations without suspicious postcontrast enhancement now measuring 4.6 x 3.4 cm previously 4.8 x 4.1 cm. No solid enhancing hepatic lesion. Cholelithiasis without findings of acute cholecystitis. No biliary ductal dilation.   Pancreas: No mass, inflammatory changes, or other parenchymal abnormality identified.   Spleen:  Within normal limits in size and appearance.   Adrenals/Urinary Tract: Bilateral adrenal glands are unremarkable. No hydronephrosis. No solid enhancing renal mass.   Stomach/Bowel: Visualized portions within the abdomen are unremarkable.    Vascular/Lymphatic: No pathologically enlarged lymph nodes identified. No abdominal aortic aneurysm demonstrated.   Other:  No abdominal ascites.   Musculoskeletal: No suspicious bone lesions identified.   IMPRESSION: 1. Benign-appearing bilobar hepatic cysts. No solid enhancing hepatic lesion. 2. No enhancing renal lesions. 3. Cholelithiasis without findings of acute cholecystitis.     Electronically Signed   By: Dahlia Bailiff M.D.   On: 08/30/2020 15:58   CLINICAL DATA:  Intermittent right lower quadrant pain.   EXAM: CT ABDOMEN AND PELVIS WITH CONTRAST   TECHNIQUE: Multidetector CT imaging of the abdomen and pelvis was performed using the standard protocol following bolus administration of intravenous contrast.   CONTRAST:  58mL OMNIPAQUE IOHEXOL 350 MG/ML  SOLN   COMPARISON:  None.   FINDINGS: Lower chest: Small, thin walled parenchymal cysts are seen within the bilateral lower lobes.   Hepatobiliary: A 4.7 cm x 3.8 cm cystic appearing area is noted within the posterior aspect of the left lobe of the liver. Additional smaller cystic appearing areas are seen within the right and left lobes. Ill-defined gallstones are seen within a moderately distended gallbladder, without evidence of gallbladder wall thickening, pericholecystic inflammation or biliary dilatation.   Pancreas: Unremarkable. No pancreatic ductal dilatation or surrounding inflammatory changes.   Spleen: Normal in size without focal abnormality.   Adrenals/Urinary Tract: Adrenal glands are unremarkable. Kidneys are normal, without renal calculi, focal lesion, or hydronephrosis. Bladder is unremarkable.   Stomach/Bowel: Stomach is within normal limits. The appendix is poorly visualized. No evidence of bowel wall thickening, distention, or inflammatory changes.   Vascular/Lymphatic: No significant vascular findings are present. No enlarged abdominal or pelvic lymph nodes.   Reproductive:  Uterus and bilateral adnexa are unremarkable.   Other: No abdominal wall hernia or abnormality. No abdominopelvic ascites.   Musculoskeletal: Marked severity degenerative changes are seen at the levels of L4-L5 and L5-S1.   IMPRESSION: 1. Cholelithiasis without evidence of acute cholecystitis. 2. Multiple hepatic cysts. 3. Marked severity degenerative changes at the levels of L4-L5 and L5-S1. 4. Poorly visualized appendix without secondary signs of appendicitis. Correlation with follow-up abdomen pelvis CT following oral contrast is recommended if clinical symptoms persist and appendicitis remains of clinical concern.     Electronically Signed   By: Virgina Norfolk M.D.   On: 09/08/2020 20:35   Assessment and Plan:  Diagnoses and all orders for this visit:   Calculus of gallbladder with chronic cholecystitis without obstruction       Laparoscopic cholecystectomy with intraoperative cholangiogram.  The surgical procedure has been discussed with the patient.  Potential risks, benefits, alternative treatments, and expected outcomes have been explained.  All of the patient's questions at this time have been answered.  The likelihood of reaching the patient's treatment goal is good.  The patient understand the proposed surgical procedure and wishes to proceed.     Carlean Jews, MD  11/22/2020 3:18 PM

## 2020-11-22 NOTE — H&P (View-Only) (Signed)
Subjective    Chief Complaint: Gallbladder       History of Present Illness: Kim Garner is a 55 y.o. female who is seen today as an office consultation at the request of Dr. Leonie Green for evaluation of Gallbladder .     This is a 55 year old female who undergoes MRI of the abdomen every 2 years to screen for renal cell carcinoma.  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  Her most recent scan revealed some gallstones with some mild thickening of the gallbladder.  Over the last couple of months, she has had a constant low-grade right-sided abdominal pain with episodes of severe exacerbation.  She had 1 episode that involved significant pain radiating through into her right shoulder associated with nausea, vomiting, as well as diarrhea.  She reports a lot of abdominal bloating.  She did not correlate her symptoms completely to eating.  She was evaluated by GI.  Her last colonoscopy was only 4 years ago and showed only a small polyp.  She is referred to [place] to discuss cholecystectomy.     Review of Systems: A complete review of systems was obtained from the patient.  I have reviewed this information and discussed as appropriate with the patient.  See HPI as well for other ROS.   Review of Systems  Constitutional: Negative.   HENT: Negative.   Eyes: Negative.   Respiratory: Negative.   Cardiovascular: Negative.   Gastrointestinal: Positive for abdominal pain, diarrhea, nausea and vomiting.  Genitourinary: Negative.   Musculoskeletal: Positive for back pain.  Skin: Negative.   Neurological: Negative.   Endo/Heme/Allergies: Negative.   Psychiatric/Behavioral: Negative.         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 Mardelle Matte        Allergies      Allergies  Allergen Reactions   Sulfa (Sulfonamide Antibiotics) Nausea and Other (See Comments)              Current Outpatient Medications on File Prior to Visit  Medication Sig Dispense Refill   omeprazole (PRILOSEC) 20 MG DR capsule 1 capsule 30 minutes before morning meal       diphenhydrAMINE (BENADRYL) 25 mg capsule 1 capsule as needed       fluticasone propionate (FLONASE) 50 mcg/actuation nasal spray Place into one nostril       gabapentin (NEURONTIN) 300 MG capsule 1 capsule       ibuprofen (MOTRIN) 200 MG tablet Take by mouth        No current facility-administered medications on file prior to visit.      Family History       Family History  Problem Relation Age of Onset   High blood pressure (Hypertension) Mother     Deep vein thrombosis (DVT or abnormal blood clot formation) Father     High blood pressure (Hypertension) Father          Social History  Tobacco Use  Smoking Status Never  Smokeless Tobacco Never      Social History  Social History         Socioeconomic History   Marital status: Married  Tobacco Use   Smoking status: Never   Smokeless tobacco: Never  Substance and Sexual Activity   Alcohol use: Yes      Alcohol/week: 5.0 - 7.0 standard drinks      Types: 5 - 7 Glasses of wine per week   Drug use: Never        Objective:         Vitals:    11/22/20 1431  BP: 126/80  Pulse: 76  Temp: 36.5 C (97.7 F)  SpO2: 97%  Weight: 84.6 kg (186 lb 9.6 oz)  Height: 175.3 cm (5\' 9" )    Body mass index is 27.56 kg/m.   Physical Exam    Constitutional:  WDWN in NAD, conversant, no obvious deformities; lying in bed comfortably Eyes:  Pupils equal, round; sclera anicteric; moist conjunctiva; no lid lag HENT:  Oral  mucosa moist; good dentition  Neck:  No masses palpated, trachea midline; no thyromegaly Lungs:  CTA bilaterally; normal respiratory effort Breasts:  symmetric, no nipple changes; no palpable masses or lymphadenopathy on either side CV:  Regular rate and rhythm; no murmurs; extremities well-perfused with no edema Abd:  +bowel sounds, soft, mildly tender in RUQ, no palpable organomegaly; small umbilical hernia at the upper edge of the umbilicus Lymphatic:  No palpable cervical or axillary lymphadenopathy Skin:  Warm, dry; no sign of jaundice Psychiatric - alert and oriented x 4; calm mood and affect     Labs, Imaging and Diagnostic Testing: CLINICAL DATA:  Genetic susceptibility to malignant neoplasm/renal cell carcinoma.   EXAM: MRI ABDOMEN WITHOUT AND WITH CONTRAST   TECHNIQUE: Multiplanar multisequence MR imaging of the abdomen was performed both before and after the administration of intravenous contrast.   CONTRAST:  95mL MULTIHANCE GADOBENATE DIMEGLUMINE 529 MG/ML IV SOLN   COMPARISON:  Multiple priors including most recent MRI abdomen August 05, 2018   FINDINGS: Lower chest: No acute findings.   Hepatobiliary: Bilobar hepatic cysts are again demonstrated with the largest cyst projecting off of the left hepatic lobe demonstrating some internal septations which it appears to extend from a macro lobulations without suspicious postcontrast enhancement now measuring 4.6 x 3.4 cm previously 4.8 x 4.1 cm. No solid enhancing hepatic lesion. Cholelithiasis without findings of acute cholecystitis. No biliary ductal dilation.   Pancreas: No mass, inflammatory changes, or other parenchymal abnormality identified.   Spleen:  Within normal limits in size and appearance.   Adrenals/Urinary Tract: Bilateral adrenal glands are unremarkable. No hydronephrosis. No solid enhancing renal mass.   Stomach/Bowel: Visualized portions within the abdomen are unremarkable.    Vascular/Lymphatic: No pathologically enlarged lymph nodes identified. No abdominal aortic aneurysm demonstrated.   Other:  No abdominal ascites.   Musculoskeletal: No suspicious bone lesions identified.   IMPRESSION: 1. Benign-appearing bilobar hepatic cysts. No solid enhancing hepatic lesion. 2. No enhancing renal lesions. 3. Cholelithiasis without findings of acute cholecystitis.     Electronically Signed   By: Dahlia Bailiff M.D.   On: 08/30/2020 15:58   CLINICAL DATA:  Intermittent right lower quadrant pain.   EXAM: CT ABDOMEN AND PELVIS WITH CONTRAST   TECHNIQUE: Multidetector CT imaging of the abdomen and pelvis was performed using the standard protocol following bolus administration of intravenous contrast.   CONTRAST:  4mL OMNIPAQUE IOHEXOL 350 MG/ML  SOLN   COMPARISON:  None.   FINDINGS: Lower chest: Small, thin walled parenchymal cysts are seen within the bilateral lower lobes.   Hepatobiliary: A 4.7 cm x 3.8 cm cystic appearing area is noted within the posterior aspect of the left lobe of the liver. Additional smaller cystic appearing areas are seen within the right and left lobes. Ill-defined gallstones are seen within a moderately distended gallbladder, without evidence of gallbladder wall thickening, pericholecystic inflammation or biliary dilatation.   Pancreas: Unremarkable. No pancreatic ductal dilatation or surrounding inflammatory changes.   Spleen: Normal in size without focal abnormality.   Adrenals/Urinary Tract: Adrenal glands are unremarkable. Kidneys are normal, without renal calculi, focal lesion, or hydronephrosis. Bladder is unremarkable.   Stomach/Bowel: Stomach is within normal limits. The appendix is poorly visualized. No evidence of bowel wall thickening, distention, or inflammatory changes.   Vascular/Lymphatic: No significant vascular findings are present. No enlarged abdominal or pelvic lymph nodes.   Reproductive:  Uterus and bilateral adnexa are unremarkable.   Other: No abdominal wall hernia or abnormality. No abdominopelvic ascites.   Musculoskeletal: Marked severity degenerative changes are seen at the levels of L4-L5 and L5-S1.   IMPRESSION: 1. Cholelithiasis without evidence of acute cholecystitis. 2. Multiple hepatic cysts. 3. Marked severity degenerative changes at the levels of L4-L5 and L5-S1. 4. Poorly visualized appendix without secondary signs of appendicitis. Correlation with follow-up abdomen pelvis CT following oral contrast is recommended if clinical symptoms persist and appendicitis remains of clinical concern.     Electronically Signed   By: Virgina Norfolk M.D.   On: 09/08/2020 20:35   Assessment and Plan:  Diagnoses and all orders for this visit:   Calculus of gallbladder with chronic cholecystitis without obstruction       Laparoscopic cholecystectomy with intraoperative cholangiogram.  The surgical procedure has been discussed with the patient.  Potential risks, benefits, alternative treatments, and expected outcomes have been explained.  All of the patient's questions at this time have been answered.  The likelihood of reaching the patient's treatment goal is good.  The patient understand the proposed surgical procedure and wishes to proceed.     Carlean Jews, MD  11/22/2020 3:18 PM

## 2020-11-30 DIAGNOSIS — K5900 Constipation, unspecified: Secondary | ICD-10-CM | POA: Diagnosis not present

## 2020-11-30 DIAGNOSIS — K8020 Calculus of gallbladder without cholecystitis without obstruction: Secondary | ICD-10-CM | POA: Diagnosis not present

## 2020-11-30 DIAGNOSIS — R1031 Right lower quadrant pain: Secondary | ICD-10-CM | POA: Diagnosis not present

## 2020-12-03 NOTE — Patient Instructions (Signed)
DUE TO COVID-19 ONLY ONE VISITOR IS ALLOWED TO COME WITH YOU AND STAY IN THE WAITING ROOM ONLY DURING PRE OP AND PROCEDURE DAY OF SURGERY IF YOU ARE GOING HOME AFTER SURGERY. IF YOU ARE SPENDING THE NIGHT 2 PEOPLE MAY VISIT WITH YOU IN YOUR PRIVATE ROOM AFTER SURGERY UNTIL VISITING  HOURS ARE OVER AT 800 PM AND 1  VISITOR  MAY  SPEND THE NIGHT.                 Kim Garner     Your procedure is scheduled on: 12/19/20   Report to [facility] Main  Entrance   Report to admitting at  6:15 AM     Call this number if you have problems the morning of surgery [phone]    Remember: Do not eat food  :After Midnight the night before your surgery,     You may have clear liquids from midnight until 5:30 am    CLEAR LIQUID DIET   Foods Allowed                                                                     Foods Excluded  Coffee and tea, regular and decaf                             liquids that you cannot  Plain Jell-O any favor except red or purple                                           see through such as: Fruit ices (not with fruit pulp)                                     milk, soups, orange juice  Iced Popsicles                                    All solid food Carbonated beverages, regular and diet                                    Cranberry, grape and apple juices Sports drinks like Gatorade Lightly seasoned clear broth or consume(fat free) Sugar     BRUSH YOUR TEETH MORNING OF SURGERY AND RINSE YOUR MOUTH OUT, NO CHEWING GUM CANDY OR MINTS.     Take these medicines the morning of surgery with A SIP OF WATER: none                                You may not have any metal on your body including hair pins and              piercings  Do not wear jewelry, make-up, lotions, powders or perfumes, deodorant  Do not wear nail polish on your fingernails.  Do not shave  48 hours prior to surgery.                Do not bring valuables to the  hospital. [place].  Contacts, dentures or bridgework may not be worn into surgery.       Patients discharged the day of surgery will not be allowed to drive home.  IF YOU ARE HAVING SURGERY AND GOING HOME THE SAME DAY, YOU MUST HAVE AN ADULT TO DRIVE YOU HOME AND BE WITH YOU FOR 24 HOURS. YOU MAY GO HOME BY TAXI OR UBER OR ORTHERWISE, BUT AN ADULT MUST ACCOMPANY YOU HOME AND STAY WITH YOU FOR 24 HOURS.  Name and phone number of your driver:  Special Instructions: N/A              Please read over the following fact sheets you were given: _____________________________________________________________________             [facility] - Preparing for Surgery Before surgery, you can play an important role.  Because skin is not sterile, your skin needs to be as free of germs as possible.  You can reduce the number of germs on your skin by washing with CHG (chlorahexidine gluconate) soap before surgery.  CHG is an antiseptic cleaner which kills germs and bonds with the skin to continue killing germs even after washing. Please DO NOT use if you have an allergy to CHG or antibacterial soaps.  If your skin becomes reddened/irritated stop using the CHG and inform your nurse when you arrive at Short Stay. Do not shave (including legs and underarms) for at least 48 hours prior to the first CHG shower.   Please follow these instructions carefully:  1.  Shower with CHG Soap the night before surgery and the  morning of Surgery.  2.  If you choose to wash your hair, wash your hair first as usual with your  normal  shampoo.  3.  After you shampoo, rinse your hair and body thoroughly to remove the  shampoo.                            4.  Use CHG as you would any other liquid soap.  You can apply chg directly  to the skin and wash                       Gently with a scrungie or clean washcloth.  5.  Apply the CHG Soap to your body ONLY FROM THE NECK DOWN.   Do  not use on face/ open                           Wound or open sores. Avoid contact with eyes, ears mouth and genitals (private parts).                       Wash face,  Genitals (private parts) with your normal soap.             6.  Wash thoroughly, paying special attention to the area where your surgery  will be performed.  7.  Thoroughly rinse your body with warm water from the neck down.  8.  DO NOT shower/wash with your normal soap after using and rinsing off  the CHG Soap.                9.  Pat yourself dry with a clean towel.            10.  Wear clean pajamas.            11.  Place clean sheets on your bed the night of your first shower and do not  sleep with pets. Day of Surgery : Do not apply any lotions/deodorants the morning of surgery.  Please wear clean clothes to the hospital/surgery center.  FAILURE TO FOLLOW THESE INSTRUCTIONS MAY RESULT IN THE CANCELLATION OF YOUR SURGERY PATIENT SIGNATURE_________________________________  NURSE SIGNATURE__________________________________  ________________________________________________________________________

## 2020-12-04 ENCOUNTER — Other Ambulatory Visit: Payer: Self-pay

## 2020-12-04 ENCOUNTER — Encounter (HOSPITAL_COMMUNITY)
Admission: RE | Admit: 2020-12-04 | Discharge: 2020-12-04 | Disposition: A | Discharge status: Home / Self Care | Payer: BLUE CROSS/BLUE SHIELD | Source: Ambulatory Visit | Attending: Surgery | Admitting: Surgery

## 2020-12-04 ENCOUNTER — Encounter (HOSPITAL_COMMUNITY): Payer: Self-pay

## 2020-12-04 VITALS — BP 113/72 | HR 79 | Temp 98.2°F | Resp 18 | Ht 69.0 in | Wt 185.0 lb

## 2020-12-04 DIAGNOSIS — Z01812 Encounter for preprocedural laboratory examination: Secondary | ICD-10-CM | POA: Diagnosis not present

## 2020-12-04 DIAGNOSIS — R7303 Prediabetes: Secondary | ICD-10-CM

## 2020-12-04 LAB — CBC
HCT: 41.8 % (ref 36.0–46.0)
Hemoglobin: 14.3 g/dL (ref 12.0–15.0)
MCH: 32.4 pg (ref 26.0–34.0)
MCHC: 34.2 g/dL (ref 30.0–36.0)
MCV: 94.6 fL (ref 80.0–100.0)
Platelets: 317 10*3/uL (ref 150–400)
RBC: 4.42 MIL/uL (ref 3.87–5.11)
RDW: 12.6 % (ref 11.5–15.5)
WBC: 7.7 10*3/uL (ref 4.0–10.5)
nRBC: 0 % (ref 0.0–0.2)

## 2020-12-04 NOTE — Progress Notes (Signed)
COVID test- NA    PCP - Dr.E. Laurann [place] Cardiologist - none  Chest x-ray - no EKG - no Stress Test - no ECHO - no Cardiac Cath - no Pacemaker/ICD device last checked:NA  Sleep Study - no CPAP -   Fasting Blood Sugar - NA Checks Blood Sugar _____ times a day  Blood Thinner Instructions:no Aspirin Instructions: Last Dose:  Anesthesia review: No  Patient denies shortness of breath, fever, cough and chest pain at PAT appointment Pt has Brit- Hogg-Dube and hasn't had problems in a long time . No SOB with any activities  Patient verbalized understanding of instructions that were given to them at the PAT appointment. Patient was also instructed that they will need to review over the PAT instructions again at home before surgery. yes

## 2020-12-18 NOTE — Anesthesia Preprocedure Evaluation (Addendum)
Anesthesia Evaluation  Patient identified by MRN, date of birth, ID band Patient awake    Reviewed: Allergy & Precautions, NPO status , Patient's Chart, lab work & pertinent test results  History of Anesthesia Complications Negative for: history of anesthetic complications  Airway Mallampati: II  TM Distance: >3 FB Neck ROM: Full    Dental  (+) Dental Advisory Given, Teeth Intact   Pulmonary  H/o spont PTX with Birt-Hogg-Dube   breath sounds clear to auscultation       Cardiovascular negative cardio ROS   Rhythm:Regular Rate:Normal     Neuro/Psych negative neurological ROS     GI/Hepatic Neg liver ROS, cholelithiasis   Endo/Other  negative endocrine ROS  Renal/GU negative Renal ROS     Musculoskeletal   Abdominal   Peds  Hematology negative hematology ROS (+)   Anesthesia Other Findings   Reproductive/Obstetrics                            Anesthesia Physical Anesthesia Plan  ASA: 3  Anesthesia Plan: General   Post-op Pain Management: Dilaudid IV and Tylenol PO (pre-op)   Induction:   PONV Risk Score and Plan: 3 and Ondansetron, Dexamethasone and Diphenhydramine  Airway Management Planned: Oral ETT  Additional Equipment: None  Intra-op Plan:   Post-operative Plan: Extubation in OR  Informed Consent: I have reviewed the patients History and Physical, chart, labs and discussed the procedure including the risks, benefits and alternatives for the proposed anesthesia with the patient or authorized representative who has indicated his/her understanding and acceptance.     Dental advisory given  Plan Discussed with: CRNA and Surgeon  Anesthesia Plan Comments:        Anesthesia Quick Evaluation

## 2020-12-19 ENCOUNTER — Encounter (HOSPITAL_COMMUNITY): Payer: Self-pay | Admitting: Surgery

## 2020-12-19 ENCOUNTER — Ambulatory Visit (HOSPITAL_COMMUNITY)
Admission: RE | Admit: 2020-12-19 | Discharge: 2020-12-19 | Disposition: A | Discharge status: Home / Self Care | Payer: BLUE CROSS/BLUE SHIELD | Source: Ambulatory Visit | Attending: Surgery | Admitting: Surgery

## 2020-12-19 ENCOUNTER — Ambulatory Visit (HOSPITAL_COMMUNITY): Payer: BLUE CROSS/BLUE SHIELD | Admitting: Certified Registered"

## 2020-12-19 ENCOUNTER — Ambulatory Visit (HOSPITAL_COMMUNITY): Payer: BLUE CROSS/BLUE SHIELD

## 2020-12-19 ENCOUNTER — Encounter (HOSPITAL_COMMUNITY)
Admission: RE | Disposition: A | Discharge status: Home / Self Care | Payer: Self-pay | Source: Ambulatory Visit | Attending: Surgery

## 2020-12-19 DIAGNOSIS — M1990 Unspecified osteoarthritis, unspecified site: Secondary | ICD-10-CM | POA: Diagnosis not present

## 2020-12-19 DIAGNOSIS — Z419 Encounter for procedure for purposes other than remedying health state, unspecified: Secondary | ICD-10-CM

## 2020-12-19 DIAGNOSIS — R7303 Prediabetes: Secondary | ICD-10-CM

## 2020-12-19 DIAGNOSIS — K8018 Calculus of gallbladder with other cholecystitis without obstruction: Secondary | ICD-10-CM | POA: Diagnosis not present

## 2020-12-19 DIAGNOSIS — K8020 Calculus of gallbladder without cholecystitis without obstruction: Secondary | ICD-10-CM | POA: Diagnosis not present

## 2020-12-19 DIAGNOSIS — Z1509 Genetic susceptibility to other malignant neoplasm: Secondary | ICD-10-CM | POA: Diagnosis not present

## 2020-12-19 DIAGNOSIS — K828 Other specified diseases of gallbladder: Secondary | ICD-10-CM | POA: Diagnosis not present

## 2020-12-19 DIAGNOSIS — K8010 Calculus of gallbladder with chronic cholecystitis without obstruction: Secondary | ICD-10-CM | POA: Insufficient documentation

## 2020-12-19 HISTORY — PX: LAPAROSCOPIC CHOLECYSTECTOMY SINGLE SITE WITH INTRAOPERATIVE CHOLANGIOGRAM: SHX6538

## 2020-12-19 LAB — PREGNANCY, URINE: Preg Test, Ur: NEGATIVE

## 2020-12-19 SURGERY — LAPAROSCOPIC CHOLECYSTECTOMY SINGLE SITE WITH INTRAOPERATIVE CHOLANGIOGRAM
Anesthesia: General

## 2020-12-19 MED ORDER — PROPOFOL 10 MG/ML IV BOLUS
INTRAVENOUS | Status: AC
  Filled 2020-12-19: qty 20

## 2020-12-19 MED ORDER — ONDANSETRON HCL 4 MG/2ML IJ SOLN
INTRAMUSCULAR | Status: DC | PRN
  Administered 2020-12-19 (×2): 4 mg via INTRAVENOUS

## 2020-12-19 MED ORDER — PROPOFOL 10 MG/ML IV BOLUS
INTRAVENOUS | Status: DC | PRN
  Administered 2020-12-19: 120 mg via INTRAVENOUS

## 2020-12-19 MED ORDER — FENTANYL CITRATE (PF) 100 MCG/2ML IJ SOLN
INTRAMUSCULAR | Status: DC | PRN
  Administered 2020-12-19: 25 ug via INTRAVENOUS
  Administered 2020-12-19 (×4): 50 ug via INTRAVENOUS
  Administered 2020-12-19: 25 ug via INTRAVENOUS

## 2020-12-19 MED ORDER — ROCURONIUM BROMIDE 100 MG/10ML IV SOLN
INTRAVENOUS | Status: DC | PRN
  Administered 2020-12-19: 10 mg via INTRAVENOUS
  Administered 2020-12-19: 60 mg via INTRAVENOUS
  Administered 2020-12-19: 10 mg via INTRAVENOUS

## 2020-12-19 MED ORDER — MEPERIDINE HCL 50 MG/ML IJ SOLN: 6.2500 mg | INTRAMUSCULAR | Status: DC | PRN

## 2020-12-19 MED ORDER — ONDANSETRON HCL 4 MG/2ML IJ SOLN
INTRAMUSCULAR | Status: AC
  Filled 2020-12-19: qty 4

## 2020-12-19 MED ORDER — OXYCODONE HCL 5 MG PO TABS
ORAL_TABLET | ORAL | Status: AC
  Filled 2020-12-19: qty 1

## 2020-12-19 MED ORDER — OXYCODONE HCL 5 MG PO TABS: 5.0000 mg | ORAL_TABLET | Freq: Four times a day (QID) | ORAL | 0 refills | Status: AC | PRN

## 2020-12-19 MED ORDER — DEXAMETHASONE SODIUM PHOSPHATE 10 MG/ML IJ SOLN
INTRAMUSCULAR | Status: DC | PRN
  Administered 2020-12-19: 10 mg via INTRAVENOUS

## 2020-12-19 MED ORDER — ROCURONIUM BROMIDE 10 MG/ML (PF) SYRINGE
PREFILLED_SYRINGE | INTRAVENOUS | Status: AC
  Filled 2020-12-19: qty 10

## 2020-12-19 MED ORDER — LACTATED RINGERS IV SOLN: INTRAVENOUS | Status: DC

## 2020-12-19 MED ORDER — CHLORHEXIDINE GLUCONATE CLOTH 2 % EX PADS: 6.0000 | MEDICATED_PAD | Freq: Once | CUTANEOUS | Status: DC

## 2020-12-19 MED ORDER — HYDROMORPHONE HCL 2 MG/ML IJ SOLN
INTRAMUSCULAR | Status: AC
  Filled 2020-12-19: qty 1

## 2020-12-19 MED ORDER — HYDROMORPHONE HCL 1 MG/ML IJ SOLN
INTRAMUSCULAR | Status: AC
  Administered 2020-12-19: 0.5 mg via INTRAVENOUS
  Filled 2020-12-19: qty 1

## 2020-12-19 MED ORDER — OXYCODONE HCL 5 MG PO TABS
5.0000 mg | ORAL_TABLET | Freq: Once | ORAL | Status: AC | PRN
  Administered 2020-12-19: 5 mg via ORAL

## 2020-12-19 MED ORDER — MIDAZOLAM HCL 5 MG/5ML IJ SOLN
INTRAMUSCULAR | Status: DC | PRN
  Administered 2020-12-19: 2 mg via INTRAVENOUS

## 2020-12-19 MED ORDER — SODIUM CHLORIDE 0.9 % IR SOLN
Status: DC | PRN
  Administered 2020-12-19: 1000 mL

## 2020-12-19 MED ORDER — CEFAZOLIN SODIUM-DEXTROSE 2-4 GM/100ML-% IV SOLN
2.0000 g | INTRAVENOUS | Status: AC
  Administered 2020-12-19: 2 g via INTRAVENOUS
  Filled 2020-12-19: qty 100

## 2020-12-19 MED ORDER — OXYCODONE HCL 5 MG/5ML PO SOLN: 5.0000 mg | Freq: Once | ORAL | Status: AC | PRN

## 2020-12-19 MED ORDER — KETOROLAC TROMETHAMINE 30 MG/ML IJ SOLN
INTRAMUSCULAR | Status: DC | PRN
  Administered 2020-12-19: 30 mg via INTRAVENOUS

## 2020-12-19 MED ORDER — LIDOCAINE 2% (20 MG/ML) 5 ML SYRINGE
INTRAMUSCULAR | Status: DC | PRN
  Administered 2020-12-19: 20 mg via INTRAVENOUS

## 2020-12-19 MED ORDER — ACETAMINOPHEN 500 MG PO TABS
1000.0000 mg | ORAL_TABLET | Freq: Once | ORAL | Status: AC
  Administered 2020-12-19: 1000 mg via ORAL
  Filled 2020-12-19: qty 2

## 2020-12-19 MED ORDER — HYDROMORPHONE HCL 1 MG/ML IJ SOLN
0.2500 mg | INTRAMUSCULAR | Status: DC | PRN
  Administered 2020-12-19: 0.5 mg via INTRAVENOUS

## 2020-12-19 MED ORDER — HYDROMORPHONE HCL 1 MG/ML IJ SOLN
INTRAMUSCULAR | Status: DC | PRN
  Administered 2020-12-19 (×2): .5 mg via INTRAVENOUS

## 2020-12-19 MED ORDER — ACETAMINOPHEN 500 MG PO TABS: 1000.0000 mg | ORAL_TABLET | ORAL | Status: DC

## 2020-12-19 MED ORDER — LIDOCAINE HCL (PF) 2 % IJ SOLN
INTRAMUSCULAR | Status: AC
  Filled 2020-12-19: qty 5

## 2020-12-19 MED ORDER — FENTANYL CITRATE (PF) 250 MCG/5ML IJ SOLN
INTRAMUSCULAR | Status: AC
  Filled 2020-12-19: qty 5

## 2020-12-19 MED ORDER — PROMETHAZINE HCL 25 MG/ML IJ SOLN
INTRAMUSCULAR | Status: AC
  Administered 2020-12-19: 6.25 mg via INTRAVENOUS
  Filled 2020-12-19: qty 1

## 2020-12-19 MED ORDER — BUPIVACAINE-EPINEPHRINE 0.25% -1:200000 IJ SOLN
INTRAMUSCULAR | Status: DC | PRN
  Administered 2020-12-19: 20 mL

## 2020-12-19 MED ORDER — SCOPOLAMINE 1 MG/3DAYS TD PT72
1.0000 | MEDICATED_PATCH | TRANSDERMAL | Status: DC
  Administered 2020-12-19: 1.5 mg via TRANSDERMAL
  Filled 2020-12-19: qty 1

## 2020-12-19 MED ORDER — MIDAZOLAM HCL 2 MG/2ML IJ SOLN
INTRAMUSCULAR | Status: AC
  Filled 2020-12-19: qty 2

## 2020-12-19 MED ORDER — ORAL CARE MOUTH RINSE: 15.0000 mL | Freq: Once | OROMUCOSAL | Status: AC

## 2020-12-19 MED ORDER — PROMETHAZINE HCL 25 MG/ML IJ SOLN: 6.2500 mg | INTRAMUSCULAR | Status: DC | PRN

## 2020-12-19 MED ORDER — SUGAMMADEX SODIUM 200 MG/2ML IV SOLN
INTRAVENOUS | Status: DC | PRN
  Administered 2020-12-19: 200 mg via INTRAVENOUS

## 2020-12-19 MED ORDER — BUPIVACAINE-EPINEPHRINE 0.25% -1:200000 IJ SOLN
INTRAMUSCULAR | Status: AC
  Filled 2020-12-19: qty 1

## 2020-12-19 MED ORDER — CHLORHEXIDINE GLUCONATE 0.12 % MT SOLN
15.0000 mL | Freq: Once | OROMUCOSAL | Status: AC
  Administered 2020-12-19: 15 mL via OROMUCOSAL

## 2020-12-19 MED ORDER — MIDAZOLAM HCL 2 MG/2ML IJ SOLN: 0.5000 mg | Freq: Once | INTRAMUSCULAR | Status: DC | PRN

## 2020-12-19 MED ORDER — DEXAMETHASONE SODIUM PHOSPHATE 10 MG/ML IJ SOLN
INTRAMUSCULAR | Status: AC
  Filled 2020-12-19: qty 1

## 2020-12-19 MED ORDER — KETOROLAC TROMETHAMINE 30 MG/ML IJ SOLN
INTRAMUSCULAR | Status: AC
  Filled 2020-12-19: qty 1

## 2020-12-19 MED ORDER — IOHEXOL 300 MG/ML  SOLN
INTRAMUSCULAR | Status: DC | PRN
  Administered 2020-12-19: 5 mL

## 2020-12-19 SURGICAL SUPPLY — 42 items
APPLIER CLIP ROT 10 11.4 M/L (STAPLE) ×2
BAG COUNTER SPONGE SURGICOUNT (BAG) IMPLANT
BENZOIN TINCTURE PRP APPL 2/3 (GAUZE/BANDAGES/DRESSINGS) ×2 IMPLANT
CHLORAPREP W/TINT 26 (MISCELLANEOUS) ×2 IMPLANT
CLIP APPLIE ROT 10 11.4 M/L (STAPLE) ×1 IMPLANT
COVER MAYO STAND STRL (DRAPES) IMPLANT
COVER SURGICAL LIGHT HANDLE (MISCELLANEOUS) ×2 IMPLANT
DECANTER SPIKE VIAL GLASS SM (MISCELLANEOUS) ×2 IMPLANT
DRAPE C-ARM 42X120 X-RAY (DRAPES) ×2 IMPLANT
DRSG TEGADERM 2-3/8X2-3/4 SM (GAUZE/BANDAGES/DRESSINGS) ×6 IMPLANT
DRSG TEGADERM 4X4.75 (GAUZE/BANDAGES/DRESSINGS) ×2 IMPLANT
ELECT REM PT RETURN 15FT ADLT (MISCELLANEOUS) ×2 IMPLANT
GAUZE SPONGE 2X2 8PLY STRL LF (GAUZE/BANDAGES/DRESSINGS) IMPLANT
GLOVE SURG ENC MOIS LTX SZ7 (GLOVE) ×2 IMPLANT
GLOVE SURG UNDER POLY LF SZ7.5 (GLOVE) ×2 IMPLANT
GOWN STRL REUS W/TWL LRG LVL3 (GOWN DISPOSABLE) ×8 IMPLANT
GOWN STRL REUS W/TWL XL LVL3 (GOWN DISPOSABLE) IMPLANT
IRRIG SUCT STRYKERFLOW 2 WTIP (MISCELLANEOUS) ×2
IRRIGATION SUCT STRKRFLW 2 WTP (MISCELLANEOUS) ×1 IMPLANT
KIT BASIN OR (CUSTOM PROCEDURE TRAY) ×2 IMPLANT
KIT TURNOVER KIT A (KITS) IMPLANT
NS IRRIG 1000ML POUR BTL (IV SOLUTION) ×2 IMPLANT
PENCIL SMOKE EVACUATOR (MISCELLANEOUS) IMPLANT
POUCH RETRIEVAL ECOSAC 10 (ENDOMECHANICALS) ×1 IMPLANT
POUCH RETRIEVAL ECOSAC 10MM (ENDOMECHANICALS) ×1
POUCH SPECIMEN RETRIEVAL 10MM (ENDOMECHANICALS) IMPLANT
PROTECTOR NERVE ULNAR (MISCELLANEOUS) IMPLANT
SCISSORS LAP 5X35 DISP (ENDOMECHANICALS) ×2 IMPLANT
SET CHOLANGIOGRAPH MIX (MISCELLANEOUS) ×2 IMPLANT
SET TUBE SMOKE EVAC HIGH FLOW (TUBING) ×2 IMPLANT
SPONGE GAUZE 2X2 STER 10/PKG (GAUZE/BANDAGES/DRESSINGS)
STRIP CLOSURE SKIN 1/2X4 (GAUZE/BANDAGES/DRESSINGS) ×2 IMPLANT
SUT MNCRL AB 4-0 PS2 18 (SUTURE) ×2 IMPLANT
SYR 20ML LL LF (SYRINGE) IMPLANT
TAPE CLOTH 4X10 WHT NS (GAUZE/BANDAGES/DRESSINGS) IMPLANT
TOWEL OR 17X26 10 PK STRL BLUE (TOWEL DISPOSABLE) ×2 IMPLANT
TOWEL OR NON WOVEN STRL DISP B (DISPOSABLE) ×2 IMPLANT
TRAY LAPAROSCOPIC (CUSTOM PROCEDURE TRAY) ×2 IMPLANT
TROCAR BLADELESS OPT 5 100 (ENDOMECHANICALS) ×4 IMPLANT
TROCAR XCEL BLUNT TIP 100MML (ENDOMECHANICALS) ×2 IMPLANT
TROCAR XCEL NON-BLD 11X100MML (ENDOMECHANICALS) ×2 IMPLANT
WATER STERILE IRR 1000ML POUR (IV SOLUTION) ×2 IMPLANT

## 2020-12-19 NOTE — Transfer of Care (Signed)
Immediate Anesthesia Transfer of Care Note  Patient: Kim Garner  Procedure(s) Performed: LAPAROSCOPIC CHOLECYSTECTOMY WITH INTRAOPERATIVE CHOLANGIOGRAM  Patient Location: PACU  Anesthesia Type:General  Level of Consciousness: drowsy and patient cooperative  Airway & Oxygen Therapy: Patient Spontanous Breathing and Patient connected to face mask oxygen  Post-op Assessment: Report given to RN and Post -op Vital signs reviewed and stable  Post vital signs: Reviewed and stable  Last Vitals:  Vitals Value Taken Time  BP 113/57 12/19/20 1018  Temp    Pulse 74 12/19/20 1021  Resp 10 12/19/20 1021  SpO2 99 % 12/19/20 1021  Vitals shown include unvalidated device data.  Last Pain:  Vitals:   12/19/20 0645  TempSrc: Oral  PainSc: 4       Patients Stated Pain Goal: 4 (13/24/40 1027)  Complications: No notable events documented.

## 2020-12-19 NOTE — Anesthesia Procedure Notes (Signed)
Procedure Name: Intubation Date/Time: 12/19/2020 8:43 AM Performed by: Gwyndolyn Saxon, CRNA Pre-anesthesia Checklist: Patient identified, Emergency Drugs available, Suction available and Patient being monitored Patient Re-evaluated:Patient Re-evaluated prior to induction Oxygen Delivery Method: Circle system utilized Preoxygenation: Pre-oxygenation with 100% oxygen Induction Type: IV induction Ventilation: Mask ventilation without difficulty Laryngoscope Size: Miller and 2 Grade View: Grade I Tube type: Oral Tube size: 7.0 mm Number of attempts: 1 Airway Equipment and Method: Stylet Placement Confirmation: ETT inserted through vocal cords under direct vision, positive ETCO2 and breath sounds checked- equal and bilateral Secured at: 21 cm Tube secured with: Tape Dental Injury: Teeth and Oropharynx as per pre-operative assessment

## 2020-12-19 NOTE — Op Note (Addendum)
Laparoscopic Cholecystectomy with IOC Procedure Note  Indications: This is a 55 year old female who undergoes MRI of the abdomen every 2 years to screen for renal cell carcinoma.  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  Her most recent scan revealed some gallstones with some mild thickening of the gallbladder.  Over the last couple of months, she has had a constant low-grade right-sided abdominal pain with episodes of severe exacerbation.  She had 1 episode that involved significant pain radiating through into her right shoulder associated with nausea, vomiting, as well as diarrhea.  She reports a lot of abdominal bloating.  She did not correlate her symptoms completely to eating.  She was evaluated by GI.  Her last colonoscopy was only 4 years ago and showed only a small polyp.   Pre-operative Diagnosis: Calculus of gallbladder with other cholecystitis, without mention of obstruction  Post-operative Diagnosis: Same  Surgeon: Maia Petties   Resident: Lucas Mallow, MD - PGY 3 I was personally present during the key and critical portions of this procedure and immediately available throughout the entire procedure, as documented in my operative note.   Anesthesia: General endotracheal anesthesia  ASA Class: 2  Procedure Details  The patient was seen again in the Holding Room. The risks, benefits, complications, treatment options, and expected outcomes were discussed with the patient. The possibilities of reaction to medication, pulmonary aspiration, perforation of viscus, bleeding, recurrent infection, finding a normal gallbladder, the need for additional procedures, failure to diagnose a condition, the possible need to convert to an open procedure, and creating a complication requiring transfusion or operation were discussed with the patient. The likelihood of improving the patient's symptoms with return to their baseline status is good.  The patient and/or family  concurred with the proposed plan, giving informed consent. The site of surgery properly noted. The patient was taken to Operating Room, identified as Kim Garner and the procedure verified as Laparoscopic Cholecystectomy with Intraoperative Cholangiogram. A Time Out was held and the above information confirmed.  Prior to the induction of general anesthesia, antibiotic prophylaxis was administered. General endotracheal anesthesia was then administered and tolerated well. After the induction, the abdomen was prepped with Chloraprep and draped in the sterile fashion. The patient was positioned in the supine position.  Local anesthetic agent was injected into the skin near the umbilicus and an incision made. She has a small umbilical hernia that we mobilized.  We dissected down to the abdominal fascia with blunt dissection.  The fascia was incised vertically and we entered the peritoneal cavity bluntly.  A pursestring suture of 0-Vicryl was placed around the fascial opening.  The Hasson cannula was inserted and secured with the stay suture.  Pneumoperitoneum was then created with CO2 and tolerated well without any adverse changes in the patient's vital signs. An 11-mm port was placed in the subxiphoid position.  Two 5-mm ports were placed in the right upper quadrant. All skin incisions were infiltrated with a local anesthetic agent before making the incision and placing the trocars.   We positioned the patient in reverse Trendelenburg, tilted slightly to the patient's left.  The gallbladder was identified, the fundus grasped and retracted cephalad. There were minimal adhesions.  Adhesions were lysed bluntly and with the electrocautery where indicated, taking care not to injure any adjacent organs or viscus. The infundibulum was grasped and retracted laterally, exposing the peritoneum overlying the triangle of Calot. This was then divided and exposed in a blunt fashion.  A critical view of the cystic duct and  cystic artery was obtained.  The cystic duct was clearly identified and bluntly dissected circumferentially. The cystic duct was ligated with a clip distally.   An incision was made in the cystic duct and the [facility] cholangiogram catheter introduced. The catheter was secured using a clip. A cholangiogram was then obtained which showed good visualization of the distal and proximal biliary tree with no sign of filling defects or obstruction.  Contrast flowed easily into the duodenum. The catheter was then removed.   The cystic duct was then ligated with clips and divided. The cystic artery was identified, dissected free, ligated with clips and divided as well.   The gallbladder was dissected from the liver bed in retrograde fashion with the electrocautery. The gallbladder was removed and placed in an Eco sac. The liver bed was irrigated and inspected. Hemostasis was achieved with the electrocautery. Copious irrigation was utilized and was repeatedly aspirated until clear.  The gallbladder and Eco sac were then removed through the umbilical port site.  The pursestring suture was used to close the umbilical fascia.    We again inspected the right upper quadrant for hemostasis.  Pneumoperitoneum was released as we removed the trocars.  4-0 Monocryl was used to close the skin.   Benzoin, steri-strips, and clean dressings were applied. The patient was then extubated and brought to the recovery room in stable condition. Instrument, sponge, and needle counts were correct at closure and at the conclusion of the case.   Findings: Cholecystitis with Cholelithiasis  Estimated Blood Loss: Minimal         Drains: none         Specimens: Gallbladder           Complications: None; patient tolerated the procedure well.         Disposition: PACU - hemodynamically stable.         Condition: stable  Imogene Burn. Georgette Dover, MD, [facility] Surgery  General Surgery   12/19/2020 10:05 AM

## 2020-12-19 NOTE — Interval H&P Note (Signed)
History and Physical Interval Note:  12/19/2020 8:10 AM  Kim Garner  has presented today for surgery, with the diagnosis of CHRONIC CALCULUS CHOLECYSTITIS.  The various methods of treatment have been discussed with the patient and family. After consideration of risks, benefits and other options for treatment, the patient has consented to  Procedure(s): LAPAROSCOPIC CHOLECYSTECTOMY WITH INTRAOPERATIVE CHOLANGIOGRAM (N/A) as a surgical intervention.  The patient's history has been reviewed, patient examined, no change in status, stable for surgery.  I have reviewed the patient's chart and labs.  Questions were answered to the patient's satisfaction.     Maia Petties

## 2020-12-19 NOTE — Anesthesia Postprocedure Evaluation (Signed)
Anesthesia Post Note  Patient: Kim Garner  Procedure(s) Performed: LAPAROSCOPIC CHOLECYSTECTOMY WITH INTRAOPERATIVE CHOLANGIOGRAM     Patient location during evaluation: PACU Anesthesia Type: General Level of consciousness: awake and alert, patient cooperative and oriented Pain management: pain level controlled Vital Signs Assessment: post-procedure vital signs reviewed and stable Respiratory status: spontaneous breathing, nonlabored ventilation and respiratory function stable Cardiovascular status: blood pressure returned to baseline and stable Postop Assessment: no apparent nausea or vomiting (nausea improving) Anesthetic complications: no   No notable events documented.  Last Vitals:  Vitals:   12/19/20 1045 12/19/20 1145  BP: 121/77 121/81  Pulse: 79 69  Resp: 13 (!) 9  Temp:  (!) 36.3 C  SpO2: 99% 100%    Last Pain:  Vitals:   12/19/20 1145  TempSrc:   PainSc: 4                  Valin Massie,E. Zakry Caso

## 2020-12-19 NOTE — Discharge Instructions (Signed)
[facility] ______[facility] LAPAROSCOPIC SURGERY: POST OP INSTRUCTIONS Always review your discharge instruction sheet given to you by the facility where your surgery was performed. IF YOU HAVE DISABILITY OR FAMILY LEAVE FORMS, YOU MUST BRING THEM TO THE OFFICE FOR PROCESSING.   DO NOT GIVE THEM TO YOUR DOCTOR.  A prescription for pain medication may be given to you upon discharge.  Take your pain medication as prescribed, if needed.  If narcotic pain medicine is not needed, then you may take acetaminophen (Tylenol) or ibuprofen (Advil) as needed. Take your usually prescribed medications unless otherwise directed. If you need a refill on your pain medication, please contact your pharmacy.  They will contact our office to request authorization. Prescriptions will not be filled after 5pm or on week-ends. You should follow a light diet the first few days after arrival home, such as soup and crackers, etc.  Be sure to include lots of fluids daily. Most patients will experience some swelling and bruising in the area of the incisions.  Ice packs will help.  Swelling and bruising can take several days to resolve.  It is common to experience some constipation if taking pain medication after surgery.  Increasing fluid intake and taking a stool softener (such as Colace) will usually help or prevent this problem from occurring.  A mild laxative (Milk of Magnesia or Miralax) should be taken according to package instructions if there are no bowel movements after 48 hours. Unless discharge instructions indicate otherwise, you may remove your bandages 24-48 hours after surgery, and you may shower at that time.  You may have steri-strips (small skin tapes) in place directly over the incision.  These strips should be left on the skin for 7-10 days.  If your surgeon used skin glue on the incision, you may shower in 24 hours.  The glue will flake off over the next 2-3 weeks.  Any sutures or staples will be  removed at the office during your follow-up visit. ACTIVITIES:  You may resume regular (light) daily activities beginning the next day--such as daily self-care, walking, climbing stairs--gradually increasing activities as tolerated.  You may have sexual intercourse when it is comfortable.  Refrain from any heavy lifting or straining until approved by your doctor. You may drive when you are no longer taking prescription pain medication, you can comfortably wear a seatbelt, and you can safely maneuver your car and apply brakes. RETURN TO WORK:  __________________________________________________________ You should see your doctor in the office for a follow-up appointment approximately 2-3 weeks after your surgery.  Make sure that you call for this appointment within a day or two after you arrive home to insure a convenient appointment time. OTHER INSTRUCTIONS: __________________________________________________________________________________________________________________________ __________________________________________________________________________________________________________________________ WHEN TO CALL YOUR DOCTOR: Fever over 101.0 Inability to urinate Continued bleeding from incision. Increased pain, redness, or drainage from the incision. Increasing abdominal pain  The clinic staff is available to answer your questions during regular business hours.  Please don't hesitate to call and ask to speak to one of the nurses for clinical concerns.  If you have a medical emergency, go to the nearest emergency room or call 911.  A surgeon from [facility] is always on call at the hospital. [address], [address], [place], [place]  [address] ? [address] [address], [place], [place]   [address] [phone] ? [phone] ? FAX [phone] Web site: [email or web address]  

## 2020-12-20 ENCOUNTER — Encounter (HOSPITAL_COMMUNITY): Payer: Self-pay | Admitting: Surgery

## 2020-12-20 LAB — SURGICAL PATHOLOGY

## 2021-02-22 ENCOUNTER — Other Ambulatory Visit: Payer: Self-pay | Admitting: Family Medicine

## 2021-02-22 DIAGNOSIS — Z1231 Encounter for screening mammogram for malignant neoplasm of breast: Secondary | ICD-10-CM

## 2021-03-01 ENCOUNTER — Other Ambulatory Visit: Payer: Self-pay

## 2021-03-01 ENCOUNTER — Ambulatory Visit
Admission: RE | Admit: 2021-03-01 | Discharge: 2021-03-01 | Disposition: A | Discharge status: Home / Self Care | Payer: BLUE CROSS/BLUE SHIELD | Source: Ambulatory Visit | Attending: Family Medicine | Admitting: Family Medicine

## 2021-03-01 DIAGNOSIS — Z1231 Encounter for screening mammogram for malignant neoplasm of breast: Secondary | ICD-10-CM | POA: Diagnosis not present

## 2021-03-05 ENCOUNTER — Other Ambulatory Visit: Payer: Self-pay | Admitting: Family Medicine

## 2021-03-05 DIAGNOSIS — R928 Other abnormal and inconclusive findings on diagnostic imaging of breast: Secondary | ICD-10-CM

## 2021-03-21 ENCOUNTER — Ambulatory Visit: Payer: BLUE CROSS/BLUE SHIELD

## 2021-03-21 ENCOUNTER — Ambulatory Visit
Admission: RE | Admit: 2021-03-21 | Discharge: 2021-03-21 | Disposition: A | Discharge status: Home / Self Care | Payer: BLUE CROSS/BLUE SHIELD | Source: Ambulatory Visit | Attending: Family Medicine | Admitting: Family Medicine

## 2021-03-21 DIAGNOSIS — R928 Other abnormal and inconclusive findings on diagnostic imaging of breast: Secondary | ICD-10-CM

## 2021-03-21 DIAGNOSIS — R922 Inconclusive mammogram: Secondary | ICD-10-CM | POA: Diagnosis not present

## 2021-04-01 ENCOUNTER — Other Ambulatory Visit: Payer: BLUE CROSS/BLUE SHIELD

## 2021-08-20 DIAGNOSIS — Z86010 Personal history of colonic polyps: Secondary | ICD-10-CM | POA: Diagnosis not present

## 2021-08-27 DIAGNOSIS — Z1509 Genetic susceptibility to other malignant neoplasm: Secondary | ICD-10-CM | POA: Diagnosis not present

## 2021-08-27 DIAGNOSIS — Z23 Encounter for immunization: Secondary | ICD-10-CM | POA: Diagnosis not present

## 2021-08-27 DIAGNOSIS — Z13220 Encounter for screening for lipoid disorders: Secondary | ICD-10-CM | POA: Diagnosis not present

## 2021-08-27 DIAGNOSIS — Z0000 Encounter for general adult medical examination without abnormal findings: Secondary | ICD-10-CM | POA: Diagnosis not present

## 2021-08-27 DIAGNOSIS — E041 Nontoxic single thyroid nodule: Secondary | ICD-10-CM | POA: Diagnosis not present

## 2021-08-27 DIAGNOSIS — Z1159 Encounter for screening for other viral diseases: Secondary | ICD-10-CM | POA: Diagnosis not present

## 2021-09-10 DIAGNOSIS — L821 Other seborrheic keratosis: Secondary | ICD-10-CM | POA: Diagnosis not present

## 2021-09-10 DIAGNOSIS — L989 Disorder of the skin and subcutaneous tissue, unspecified: Secondary | ICD-10-CM | POA: Diagnosis not present

## 2021-09-18 DIAGNOSIS — N926 Irregular menstruation, unspecified: Secondary | ICD-10-CM | POA: Diagnosis not present

## 2021-09-18 DIAGNOSIS — Z01419 Encounter for gynecological examination (general) (routine) without abnormal findings: Secondary | ICD-10-CM | POA: Diagnosis not present

## 2021-09-19 DIAGNOSIS — H6121 Impacted cerumen, right ear: Secondary | ICD-10-CM | POA: Diagnosis not present

## 2021-10-30 DIAGNOSIS — K648 Other hemorrhoids: Secondary | ICD-10-CM | POA: Diagnosis not present

## 2021-10-30 DIAGNOSIS — Z86010 Personal history of colonic polyps: Secondary | ICD-10-CM | POA: Diagnosis not present

## 2021-10-30 DIAGNOSIS — D123 Benign neoplasm of transverse colon: Secondary | ICD-10-CM | POA: Diagnosis not present

## 2021-10-30 DIAGNOSIS — K5730 Diverticulosis of large intestine without perforation or abscess without bleeding: Secondary | ICD-10-CM | POA: Diagnosis not present

## 2021-11-06 DIAGNOSIS — H9121 Sudden idiopathic hearing loss, right ear: Secondary | ICD-10-CM | POA: Diagnosis not present

## 2021-11-06 DIAGNOSIS — H9041 Sensorineural hearing loss, unilateral, right ear, with unrestricted hearing on the contralateral side: Secondary | ICD-10-CM | POA: Diagnosis not present

## 2021-11-07 ENCOUNTER — Other Ambulatory Visit: Payer: Self-pay | Admitting: Anesthesiology

## 2021-11-07 DIAGNOSIS — H9121 Sudden idiopathic hearing loss, right ear: Secondary | ICD-10-CM

## 2021-11-11 ENCOUNTER — Ambulatory Visit
Admission: RE | Admit: 2021-11-11 | Discharge: 2021-11-11 | Disposition: A | Discharge status: Home / Self Care | Payer: BLUE CROSS/BLUE SHIELD | Source: Ambulatory Visit | Attending: Anesthesiology | Admitting: Anesthesiology

## 2021-11-11 DIAGNOSIS — H9123 Sudden idiopathic hearing loss, bilateral: Secondary | ICD-10-CM | POA: Diagnosis not present

## 2021-11-11 DIAGNOSIS — H9121 Sudden idiopathic hearing loss, right ear: Secondary | ICD-10-CM

## 2021-11-11 MED ORDER — GADOPICLENOL 0.5 MMOL/ML IV SOLN
8.0000 mL | Freq: Once | INTRAVENOUS | Status: AC | PRN
  Administered 2021-11-11: 8 mL via INTRAVENOUS

## 2021-11-24 ENCOUNTER — Other Ambulatory Visit: Payer: BLUE CROSS/BLUE SHIELD

## 2022-01-18 DIAGNOSIS — J069 Acute upper respiratory infection, unspecified: Secondary | ICD-10-CM | POA: Diagnosis not present

## 2022-01-18 DIAGNOSIS — R051 Acute cough: Secondary | ICD-10-CM | POA: Diagnosis not present

## 2022-01-18 DIAGNOSIS — Z03818 Encounter for observation for suspected exposure to other biological agents ruled out: Secondary | ICD-10-CM | POA: Diagnosis not present

## 2022-02-06 DIAGNOSIS — H918X3 Other specified hearing loss, bilateral: Secondary | ICD-10-CM | POA: Diagnosis not present

## 2022-02-06 DIAGNOSIS — H903 Sensorineural hearing loss, bilateral: Secondary | ICD-10-CM | POA: Diagnosis not present

## 2022-02-17 ENCOUNTER — Other Ambulatory Visit: Payer: Self-pay | Admitting: Family Medicine

## 2022-02-17 DIAGNOSIS — Z1231 Encounter for screening mammogram for malignant neoplasm of breast: Secondary | ICD-10-CM

## 2022-02-27 DIAGNOSIS — Z961 Presence of intraocular lens: Secondary | ICD-10-CM | POA: Diagnosis not present

## 2022-02-27 DIAGNOSIS — H16223 Keratoconjunctivitis sicca, not specified as Sjogren's, bilateral: Secondary | ICD-10-CM | POA: Diagnosis not present

## 2022-02-27 DIAGNOSIS — H0288B Meibomian gland dysfunction left eye, upper and lower eyelids: Secondary | ICD-10-CM | POA: Diagnosis not present

## 2022-02-27 DIAGNOSIS — H43812 Vitreous degeneration, left eye: Secondary | ICD-10-CM | POA: Diagnosis not present

## 2022-03-22 DIAGNOSIS — M79644 Pain in right finger(s): Secondary | ICD-10-CM | POA: Diagnosis not present

## 2022-04-01 ENCOUNTER — Ambulatory Visit
Admission: RE | Admit: 2022-04-01 | Discharge: 2022-04-01 | Disposition: A | Discharge status: Home / Self Care | Payer: BLUE CROSS/BLUE SHIELD | Source: Ambulatory Visit | Attending: Family Medicine | Admitting: Family Medicine

## 2022-04-01 DIAGNOSIS — Z1231 Encounter for screening mammogram for malignant neoplasm of breast: Secondary | ICD-10-CM | POA: Diagnosis not present

## 2022-08-29 ENCOUNTER — Other Ambulatory Visit: Payer: Self-pay | Admitting: Family Medicine

## 2022-08-29 DIAGNOSIS — Z13220 Encounter for screening for lipoid disorders: Secondary | ICD-10-CM | POA: Diagnosis not present

## 2022-08-29 DIAGNOSIS — Z1509 Genetic susceptibility to other malignant neoplasm: Secondary | ICD-10-CM | POA: Diagnosis not present

## 2022-08-29 DIAGNOSIS — E041 Nontoxic single thyroid nodule: Secondary | ICD-10-CM

## 2022-08-29 DIAGNOSIS — J301 Allergic rhinitis due to pollen: Secondary | ICD-10-CM | POA: Diagnosis not present

## 2022-08-29 DIAGNOSIS — N951 Menopausal and female climacteric states: Secondary | ICD-10-CM | POA: Diagnosis not present

## 2022-08-29 DIAGNOSIS — K219 Gastro-esophageal reflux disease without esophagitis: Secondary | ICD-10-CM | POA: Diagnosis not present

## 2022-08-29 DIAGNOSIS — Z0000 Encounter for general adult medical examination without abnormal findings: Secondary | ICD-10-CM | POA: Diagnosis not present

## 2022-08-29 DIAGNOSIS — R8290 Unspecified abnormal findings in urine: Secondary | ICD-10-CM | POA: Diagnosis not present

## 2022-09-02 ENCOUNTER — Other Ambulatory Visit: Payer: Self-pay | Admitting: Family Medicine

## 2022-09-02 DIAGNOSIS — Q8789 Other specified congenital malformation syndromes, not elsewhere classified: Secondary | ICD-10-CM

## 2022-09-05 ENCOUNTER — Ambulatory Visit
Admission: RE | Admit: 2022-09-05 | Discharge: 2022-09-05 | Disposition: A | Discharge status: Home / Self Care | Payer: BLUE CROSS/BLUE SHIELD | Source: Ambulatory Visit | Attending: Family Medicine | Admitting: Family Medicine

## 2022-09-05 DIAGNOSIS — E042 Nontoxic multinodular goiter: Secondary | ICD-10-CM | POA: Diagnosis not present

## 2022-09-05 DIAGNOSIS — E041 Nontoxic single thyroid nodule: Secondary | ICD-10-CM

## 2022-09-29 ENCOUNTER — Ambulatory Visit
Admission: RE | Admit: 2022-09-29 | Discharge: 2022-09-29 | Disposition: A | Discharge status: Home / Self Care | Payer: BLUE CROSS/BLUE SHIELD | Source: Ambulatory Visit | Attending: Family Medicine | Admitting: Family Medicine

## 2022-09-29 DIAGNOSIS — Q8789 Other specified congenital malformation syndromes, not elsewhere classified: Secondary | ICD-10-CM | POA: Diagnosis not present

## 2022-09-29 MED ORDER — GADOPICLENOL 0.5 MMOL/ML IV SOLN
8.0000 mL | Freq: Once | INTRAVENOUS | Status: AC | PRN
  Administered 2022-09-29: 8 mL via INTRAVENOUS

## 2022-10-06 ENCOUNTER — Other Ambulatory Visit: Payer: Self-pay | Admitting: Nurse Practitioner

## 2022-10-06 ENCOUNTER — Other Ambulatory Visit (HOSPITAL_COMMUNITY)
Admission: RE | Admit: 2022-10-06 | Discharge: 2022-10-06 | Disposition: A | Discharge status: Home / Self Care | Payer: BLUE CROSS/BLUE SHIELD | Source: Ambulatory Visit | Attending: Nurse Practitioner | Admitting: Nurse Practitioner

## 2022-10-06 DIAGNOSIS — N951 Menopausal and female climacteric states: Secondary | ICD-10-CM | POA: Diagnosis not present

## 2022-10-06 DIAGNOSIS — Z124 Encounter for screening for malignant neoplasm of cervix: Secondary | ICD-10-CM | POA: Insufficient documentation

## 2022-10-06 DIAGNOSIS — Z01419 Encounter for gynecological examination (general) (routine) without abnormal findings: Secondary | ICD-10-CM | POA: Diagnosis not present

## 2022-10-09 LAB — CYTOLOGY - PAP
Comment: NEGATIVE
Diagnosis: NEGATIVE
High risk HPV: NEGATIVE

## 2023-03-19 ENCOUNTER — Other Ambulatory Visit: Payer: Self-pay | Admitting: Family Medicine

## 2023-03-19 DIAGNOSIS — Z1231 Encounter for screening mammogram for malignant neoplasm of breast: Secondary | ICD-10-CM

## 2023-04-02 ENCOUNTER — Ambulatory Visit
Admission: RE | Admit: 2023-04-02 | Discharge: 2023-04-02 | Disposition: A | Discharge status: Home / Self Care | Source: Ambulatory Visit | Attending: Family Medicine | Admitting: Family Medicine

## 2023-04-02 DIAGNOSIS — Z1231 Encounter for screening mammogram for malignant neoplasm of breast: Secondary | ICD-10-CM

## 2023-08-17 IMAGING — CT CT ABD-PELV W/ CM
2 of 5 series · 16 of 46 positions shown, 18 images · IV contrast (omnipaque)
Comparison: None.

CLINICAL DATA: Intermittent right lower quadrant pain.

EXAM:
CT ABDOMEN AND PELVIS WITH CONTRAST
TECHNIQUE: Multidetector CT imaging of the abdomen and pelvis was performed
using the standard protocol following bolus administration of
intravenous contrast.
CONTRAST:  80mL OMNIPAQUE IOHEXOL 350 MG/ML SOLN

[Series 4: axial st · axial · 0.72mm/px · z∈[-429,-39]mm · 13 of 90 slices shown, 15 images]
[im 6/90  soft-tissue]
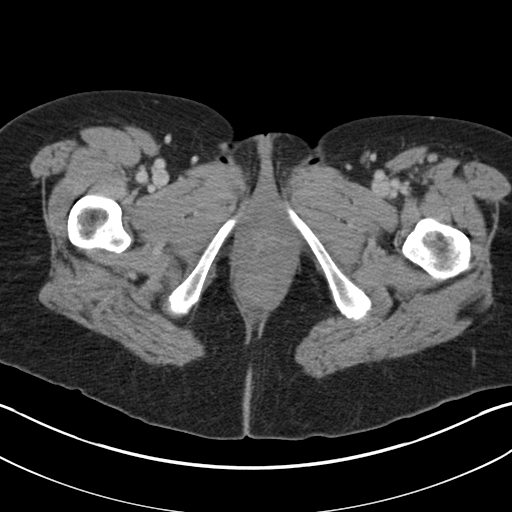
[im 6/90  bone]
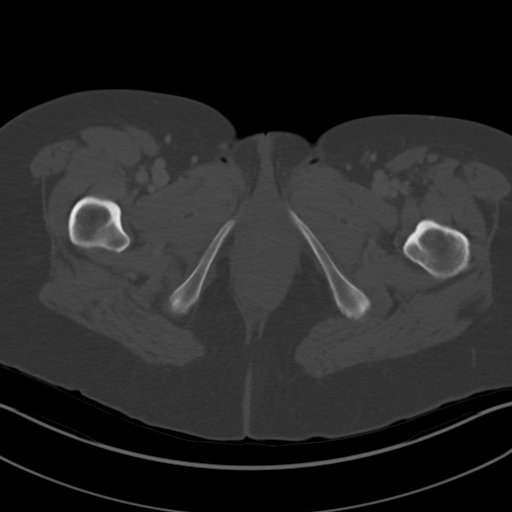
[im 12/90  soft-tissue]
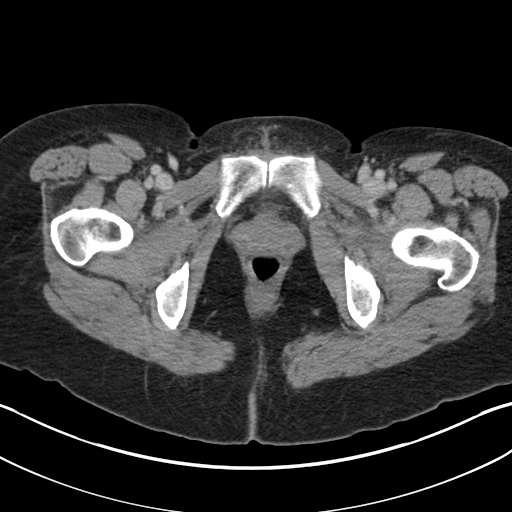
[im 18/90  soft-tissue]
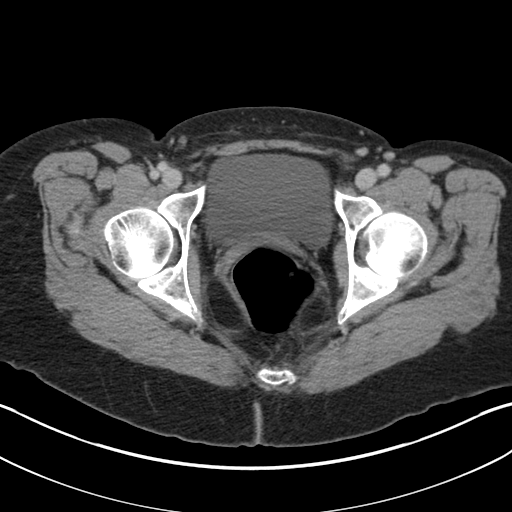
[im 24/90  soft-tissue]
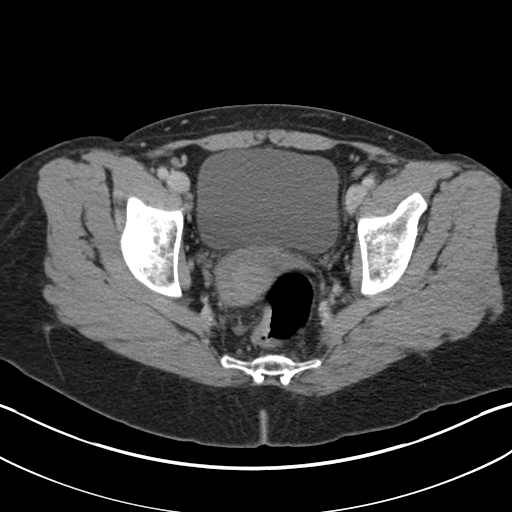
[im 30/90  soft-tissue]
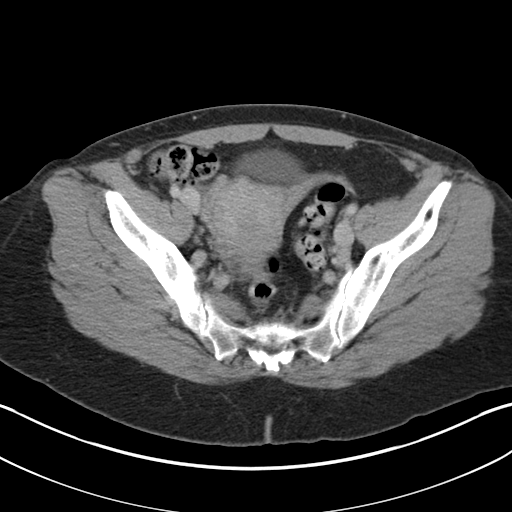
[im 36/90  soft-tissue]
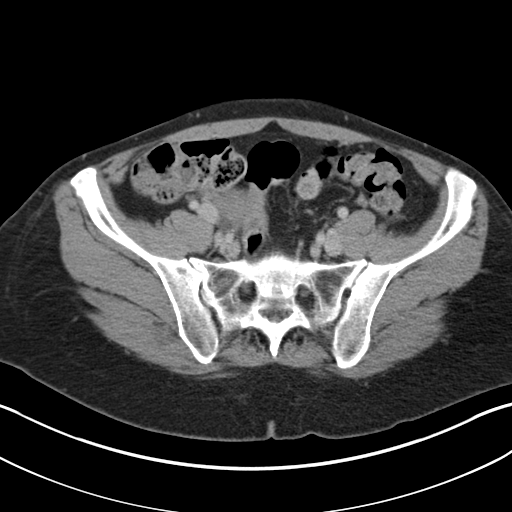
[im 48/90  soft-tissue]
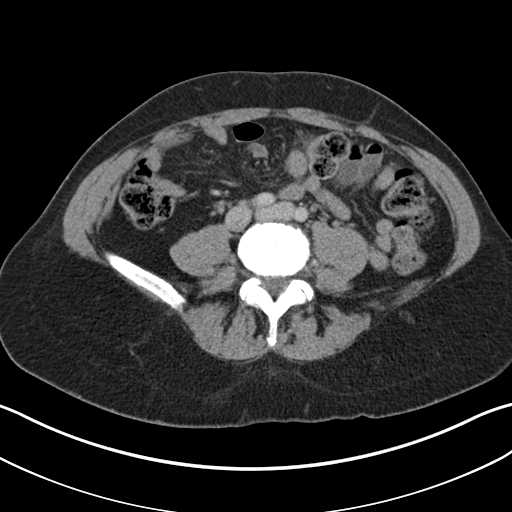
[im 54/90  soft-tissue]
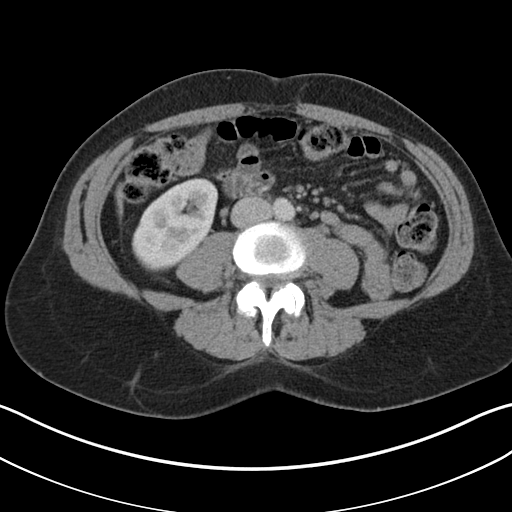
[im 60/90  soft-tissue]
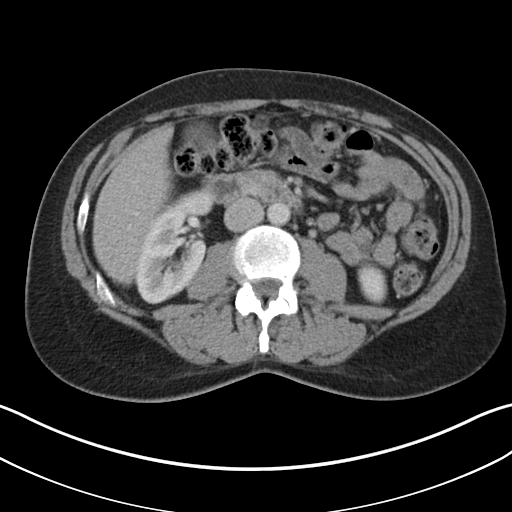
[im 60/90  bone]
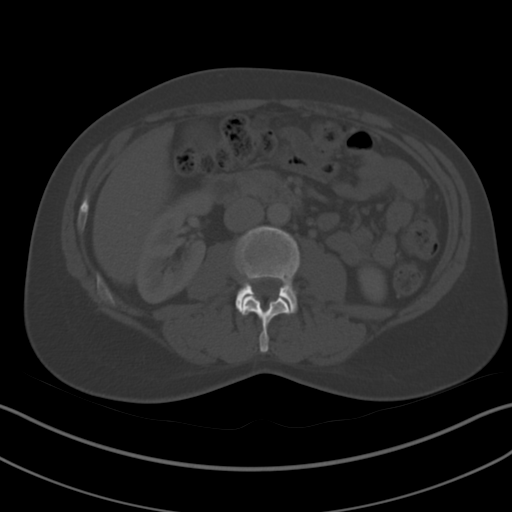
[im 66/90  soft-tissue]
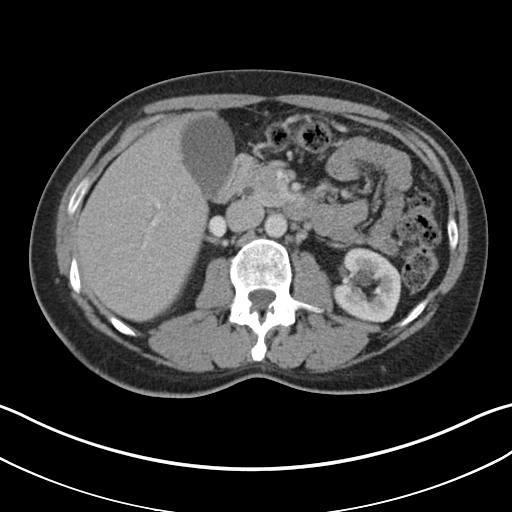
[im 72/90  soft-tissue]
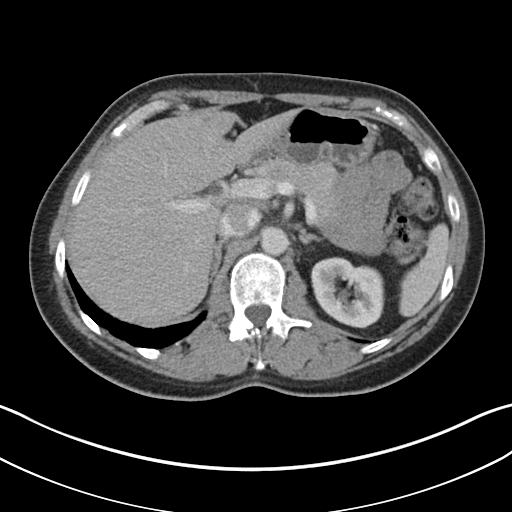
[im 78/90  soft-tissue]
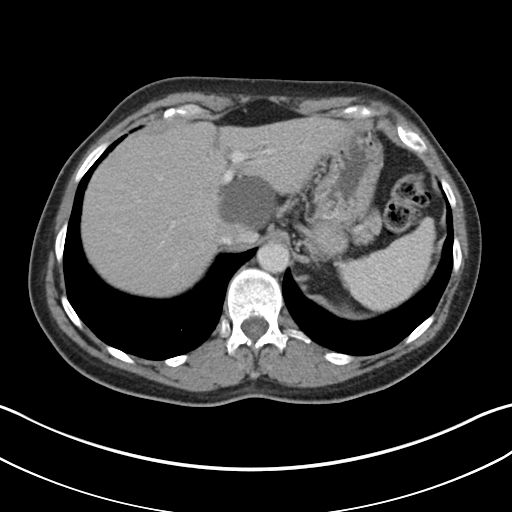
[im 84/90  soft-tissue]
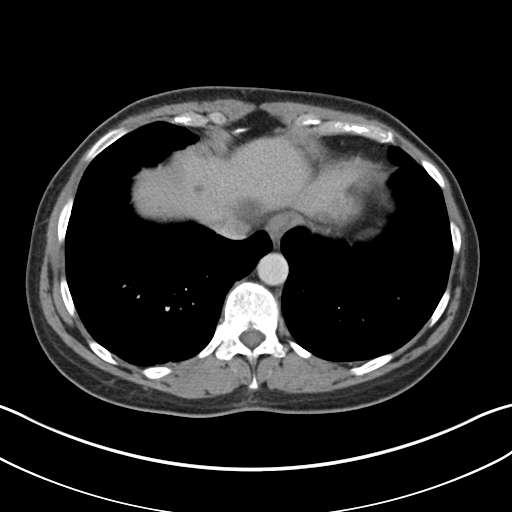

[Series 7: coronal st · coronal · 0.77mm/px · 3 of 148 slices shown]
[im 50/148  soft-tissue]
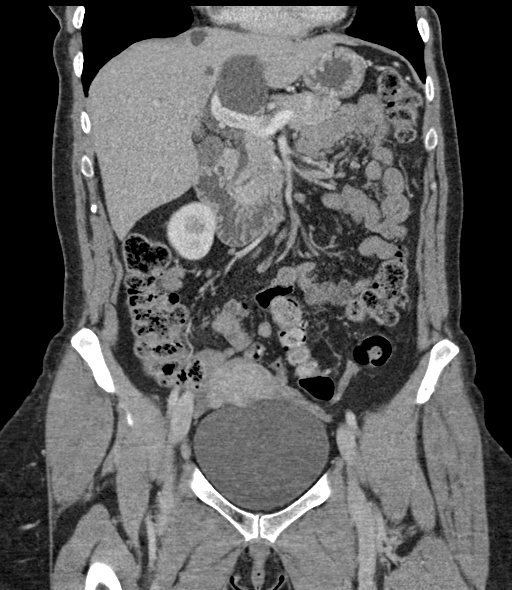
[im 66/148  soft-tissue]
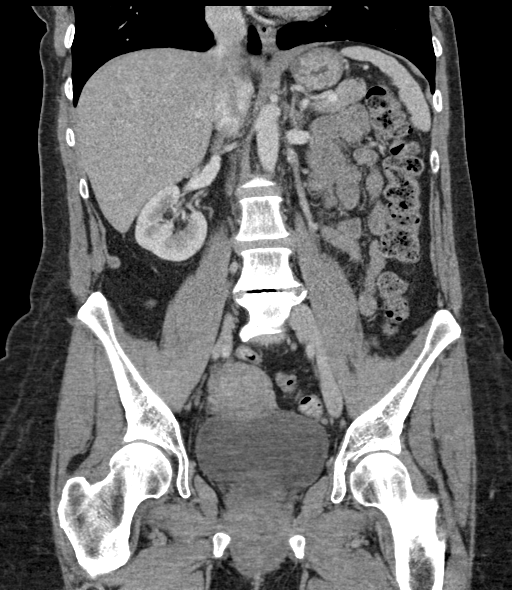
[im 82/148  soft-tissue]
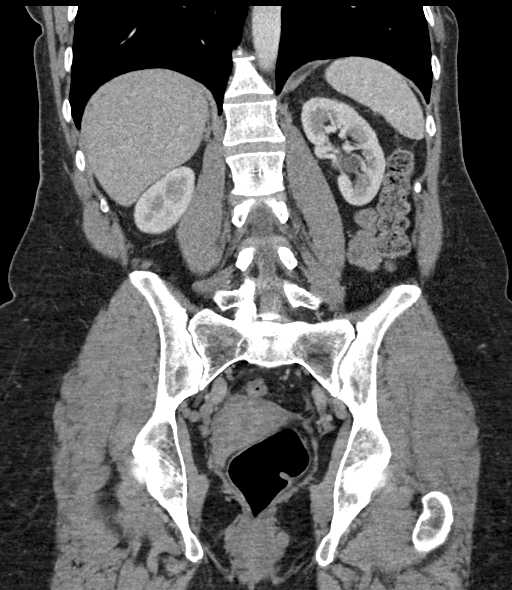

[16 of 46 positions shown; findings below may reference images not displayed]

FINDINGS: Lower chest: Small, thin walled parenchymal cysts are seen within
the bilateral lower lobes.

Hepatobiliary: A 4.7 cm x 3.8 cm cystic appearing area is noted
within the posterior aspect of the left lobe of the liver.
Additional smaller cystic appearing areas are seen within the right
and left lobes. Ill-defined gallstones are seen within a moderately
distended gallbladder, without evidence of gallbladder wall
thickening, pericholecystic inflammation or biliary dilatation.

Pancreas: Unremarkable. No pancreatic ductal dilatation or
surrounding inflammatory changes.

Spleen: Normal in size without focal abnormality.

Adrenals/Urinary Tract: Adrenal glands are unremarkable. Kidneys are
normal, without renal calculi, focal lesion, or hydronephrosis.
Bladder is unremarkable.

Stomach/Bowel: Stomach is within normal limits. The appendix is
poorly visualized. No evidence of bowel wall thickening, distention,
or inflammatory changes.

Vascular/Lymphatic: No significant vascular findings are present. No
enlarged abdominal or pelvic lymph nodes.

Reproductive: Uterus and bilateral adnexa are unremarkable.

Other: No abdominal wall hernia or abnormality. No abdominopelvic
ascites.

Musculoskeletal: Marked severity degenerative changes are seen at
the levels of L4-L5 and L5-S1.
IMPRESSION: 1. Cholelithiasis without evidence of acute cholecystitis.
2. Multiple hepatic cysts.
3. Marked severity degenerative changes at the levels of L4-L5 and
L5-S1.
4. Poorly visualized appendix without secondary signs of
appendicitis. Correlation with follow-up abdomen pelvis CT following
oral contrast is recommended if clinical symptoms persist and
appendicitis remains of clinical concern.

## 2023-11-20 ENCOUNTER — Encounter: Payer: Self-pay | Admitting: *Deleted

## 2023-11-23 NOTE — Progress Notes (Signed)
 Cardiology Office Note:  .   Date:  11/24/2023  ID:  Kim Garner, DOB 05-Dec-1965, MRN [id number] PCP: Signa Dire, MD (Inactive)   [facility] Providers Cardiologist:  Soyla DELENA Merck, MD    History of Present Illness: Kim Garner is a 58 y.o. female.  Discussed the use of AI scribe software for clinical note transcription with the patient, who gave verbal consent to proceed.  History of Present Illness Kim Garner is a 58 year old female who presents for evaluation of her echocardiogram results due to a family history of aortic aneurysm.  Her mother has a 5 cm aortic aneurysm and has experienced a hemorrhagic pericardial effusion.   She experiences increased heart rate and exhaustion during exertion, such as climbing mountains in hot weather, but otherwise is well with high activity tolerance.  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 She undergoes routine MRI screenings for renal cancer surveillance due to the syndrome. She experiences occasional shoulder pain but no recent pneumothoraces.  She has high cholesterol, with recent lab work showing an LDL of 135 mg/dL, HDL of 76 mg/dL, and triglycerides of 81 mg/dL. She has started a low-dose GLP-1 medication for weight management and uses an estrogen patch for night sweats. She is attempting dietary changes to manage her cholesterol.  Her family history is significant for her mother's late-onset hypertension and aortic aneurysm. Her son has also been diagnosed with Birt-Hogg-Dub syndrome.  No chest pain, shortness of breath, or leg swelling. Occasional lightheadedness is noted.    ROS: negative except per HPI above.  Studies Reviewed: SABRA   EKG Interpretation Date/Time:  Tuesday November 24 2023 09:11:35 EST Ventricular Rate:  75 PR Interval:  144 QRS Duration:  62 QT Interval:  368 QTC Calculation: 410 R Axis:   36  Text Interpretation: Normal sinus rhythm Possible  Left atrial enlargement Confirmed by Merck Soyla ([id number]) on 11/24/2023 9:26:20 AM    Results LABS LDL: 135 (09/2023) HDL: 76 (09/2023) Triglycerides: 81 (09/2023)  DIAGNOSTIC Echocardiogram: Normal left ventricular function, mild left ventricular hypertrophy, grade 1 diastolic dysfunction, normal atrial size, normal right ventricular function, normal aortic valve, normal mitral valve, no pulmonary hypertension, normal pulmonary valve, normal pericardium, normal aorta, normal pulmonary artery, normal inferior vena cava EKG: Normal rhythm, possible atrial enlargement (11/24/2023) Risk Assessment/Calculations:       Physical Exam:   VS:  BP 116/82 (BP Location: Left Arm, Patient Position: Sitting)   Pulse 75   Ht 5' 9 (1.753 m)   Wt 188 lb 3.2 oz (85.4 kg)   SpO2 98%   BMI 27.79 kg/m    Wt Readings from Last 3 Encounters:  11/24/23 188 lb 3.2 oz (85.4 kg)  12/19/20 185 lb (83.9 kg)  12/04/20 185 lb (83.9 kg)     Physical Exam MEASUREMENTS: Height- 5'9. GENERAL: Alert, cooperative, well developed, no acute distress. HEENT: Normocephalic, normal oropharynx, moist mucous membranes. CHEST: Clear to auscultation bilaterally, no wheezes, rhonchi, or crackles. CARDIOVASCULAR: Normal heart rate and rhythm, S1 and S2 normal without murmurs. ABDOMEN: Soft, non-tender, non-distended, without organomegaly, normal bowel sounds. EXTREMITIES: No cyanosis or edema. NEUROLOGICAL: Cranial nerves grossly intact, moves all extremities without gross motor or sensory deficit.   ASSESSMENT AND PLAN: .    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 Echocardiogram normal, no pulmonary hypertension.  - Continue routine screening imaging for renal cancer.  Family history of TAA - Normal aortic measurements despite family history of thoracic aneurysm. - Repeat echocardiogram in one year  to monitor cardiac function and aortic measurements.  Hyperlipidemia LDL at 135 mg/dL, HDL at 76 mg/dL, triglycerides at 81 mg/dL. No coronary calcification on CT. Discussed dietary modifications to lower LDL. - Implement dietary changes to lower LDL levels. - Consider calcium scoring CT scan if LDL levels do not improve with dietary changes.  Perimenopausal symptoms Cardiac risk counseling Experiencing symptoms including night sweats. Using estrogen patch. Discussed protein intake with GLP-1 medication. - OK to continue estrogen patch for symptom management from cardiovascular perspective.  - review of echo suggests grossly normal diastolic function (preserved medial e') though I cannot confirm without review of echo images.         Soyla Merck, MD, FACC

## 2023-11-24 ENCOUNTER — Ambulatory Visit: Attending: Internal Medicine | Admitting: Internal Medicine

## 2023-11-24 VITALS — BP 116/82 | HR 75 | Ht 69.0 in | Wt 188.2 lb

## 2023-11-24 DIAGNOSIS — E7800 Pure hypercholesterolemia, unspecified: Secondary | ICD-10-CM

## 2023-11-24 DIAGNOSIS — Q8789 Other specified congenital malformation syndromes, not elsewhere classified: Secondary | ICD-10-CM | POA: Diagnosis not present

## 2023-11-24 DIAGNOSIS — M24022 Loose body in left elbow: Secondary | ICD-10-CM

## 2023-11-24 DIAGNOSIS — Z8249 Family history of ischemic heart disease and other diseases of the circulatory system: Secondary | ICD-10-CM

## 2023-11-24 DIAGNOSIS — Z7189 Other specified counseling: Secondary | ICD-10-CM

## 2023-11-24 NOTE — Patient Instructions (Signed)
 Medication Instructions:  No Changes *If you need a refill on your cardiac medications before your next appointment, please call your pharmacy*  Lab Work: None  Testing/Procedures: Your physician has requested that you have an echocardiogram, to be done in about one year (due late Oct 2026/early Nov 2026). Echocardiography is a painless test that uses sound waves to create images of your heart. It provides your doctor with information about the size and shape of your heart and how well your heart's chambers and valves are working. This procedure takes approximately one hour. There are no restrictions for this procedure. Please do NOT wear cologne, perfume, aftershave, or lotions (deodorant is allowed). Please arrive 15 minutes prior to your appointment time.  Please note: We ask at that you not bring children with you during ultrasound (echo/ vascular) testing. Due to room size and safety concerns, children are not allowed in the ultrasound rooms during exams. Our front office staff cannot provide observation of children in our lobby area while testing is being conducted. An adult accompanying a patient to their appointment will only be allowed in the ultrasound room at the discretion of the ultrasound technician under special circumstances. We apologize for any inconvenience.   Follow-Up: At [facility], you and your health needs are our priority.  As part of our continuing mission to provide you with exceptional heart care, our providers are all part of one team.  This team includes your primary Cardiologist (physician) and Advanced Practice Providers or APPs (Physician Assistants and Nurse Practitioners) who all work together to provide you with the care you need, when you need it.  Your next appointment:   1 year(s) (Due Nov 2026; please call in August 2026 for an appointment)  Provider:   Soyla DELENA Merck, MD   Other Instructions Please call us  or send a MyChart message with  any Cardiology related questions/concerns.  [phone].  Thank you!

## 2024-02-12 ENCOUNTER — Ambulatory Visit: Admitting: Internal Medicine

## 2024-11-14 ENCOUNTER — Ambulatory Visit (HOSPITAL_COMMUNITY)
